# Patient Record
Sex: Female | Born: 1970 | Race: White | Hispanic: No | Smoking: Current every day smoker
Health system: Southern US, Community
[De-identification: ages and names within clinical notes are randomized; demographics above are authoritative.]

## PROBLEM LIST (undated history)

## (undated) DIAGNOSIS — F32A Depression, unspecified: Secondary | ICD-10-CM

## (undated) DIAGNOSIS — R29898 Other symptoms and signs involving the musculoskeletal system: Secondary | ICD-10-CM

## (undated) DIAGNOSIS — F419 Anxiety disorder, unspecified: Secondary | ICD-10-CM

## (undated) DIAGNOSIS — I10 Essential (primary) hypertension: Secondary | ICD-10-CM

## (undated) DIAGNOSIS — D649 Anemia, unspecified: Secondary | ICD-10-CM

## (undated) DIAGNOSIS — F329 Major depressive disorder, single episode, unspecified: Secondary | ICD-10-CM

## (undated) HISTORY — DX: Anemia, unspecified: D64.9

## (undated) HISTORY — DX: Anxiety disorder, unspecified: F41.9

---

## 2002-09-30 DIAGNOSIS — G4700 Insomnia, unspecified: Secondary | ICD-10-CM | POA: Insufficient documentation

## 2005-04-09 ENCOUNTER — Emergency Department: Payer: Self-pay | Admitting: Emergency Medicine

## 2005-05-12 ENCOUNTER — Emergency Department: Payer: Self-pay | Admitting: Emergency Medicine

## 2005-05-12 ENCOUNTER — Other Ambulatory Visit: Payer: Self-pay

## 2005-05-31 ENCOUNTER — Ambulatory Visit: Payer: Self-pay

## 2005-08-24 ENCOUNTER — Emergency Department: Payer: Self-pay | Admitting: Emergency Medicine

## 2005-10-04 ENCOUNTER — Ambulatory Visit: Payer: Self-pay | Admitting: Family Medicine

## 2006-10-20 DIAGNOSIS — I10 Essential (primary) hypertension: Secondary | ICD-10-CM | POA: Insufficient documentation

## 2007-08-25 DIAGNOSIS — K089 Disorder of teeth and supporting structures, unspecified: Secondary | ICD-10-CM | POA: Insufficient documentation

## 2007-12-29 ENCOUNTER — Ambulatory Visit: Payer: Self-pay | Admitting: Obstetrics and Gynecology

## 2009-02-27 ENCOUNTER — Emergency Department: Payer: Self-pay

## 2009-03-01 ENCOUNTER — Other Ambulatory Visit: Payer: Self-pay | Admitting: Family Medicine

## 2009-03-03 ENCOUNTER — Other Ambulatory Visit: Payer: Self-pay | Admitting: Family Medicine

## 2009-03-05 LAB — HM COLONOSCOPY: HM Colonoscopy: NORMAL

## 2009-03-20 ENCOUNTER — Other Ambulatory Visit: Payer: Self-pay | Admitting: Family Medicine

## 2009-04-28 ENCOUNTER — Other Ambulatory Visit: Payer: Self-pay

## 2009-05-04 ENCOUNTER — Ambulatory Visit: Payer: Self-pay

## 2010-10-20 ENCOUNTER — Emergency Department: Payer: Self-pay | Admitting: Emergency Medicine

## 2010-12-05 DIAGNOSIS — M533 Sacrococcygeal disorders, not elsewhere classified: Secondary | ICD-10-CM | POA: Insufficient documentation

## 2011-02-08 DIAGNOSIS — N920 Excessive and frequent menstruation with regular cycle: Secondary | ICD-10-CM | POA: Insufficient documentation

## 2011-04-25 DIAGNOSIS — R892 Abnormal level of other drugs, medicaments and biological substances in specimens from other organs, systems and tissues: Secondary | ICD-10-CM | POA: Insufficient documentation

## 2011-12-27 DIAGNOSIS — M5414 Radiculopathy, thoracic region: Secondary | ICD-10-CM | POA: Insufficient documentation

## 2012-01-17 DIAGNOSIS — M542 Cervicalgia: Secondary | ICD-10-CM | POA: Insufficient documentation

## 2012-04-10 DIAGNOSIS — N3946 Mixed incontinence: Secondary | ICD-10-CM | POA: Insufficient documentation

## 2012-04-10 DIAGNOSIS — R350 Frequency of micturition: Secondary | ICD-10-CM | POA: Insufficient documentation

## 2013-02-03 ENCOUNTER — Other Ambulatory Visit: Payer: Self-pay | Admitting: Family Medicine

## 2013-02-03 LAB — COMPREHENSIVE METABOLIC PANEL
Albumin: 3.3 g/dL — ABNORMAL LOW (ref 3.4–5.0)
Alkaline Phosphatase: 131 U/L (ref 50–136)
Anion Gap: 7 (ref 7–16)
BUN: 5 mg/dL — ABNORMAL LOW (ref 7–18)
Bilirubin,Total: 0.3 mg/dL (ref 0.2–1.0)
Calcium, Total: 8.7 mg/dL (ref 8.5–10.1)
Chloride: 103 mmol/L (ref 98–107)
Co2: 27 mmol/L (ref 21–32)
Creatinine: 0.65 mg/dL (ref 0.60–1.30)
EGFR (Non-African Amer.): >60
Glucose: 80 mg/dL (ref 65–99)
Osmolality: 270 (ref 275–301)
SGOT(AST): 32 U/L (ref 15–37)
SGPT (ALT): 23 U/L (ref 12–78)
Sodium: 137 mmol/L (ref 136–145)
Total Protein: 6.8 g/dL (ref 6.4–8.2)

## 2013-02-03 LAB — TSH: Thyroid Stimulating Horm: 1.28 u[IU]/mL

## 2013-02-03 LAB — CBC
MCH: 30.7 pg (ref 26.0–34.0)
MCHC: 33.6 g/dL (ref 32.0–36.0)
Platelet: 241 10*3/uL (ref 150–440)
RDW: 16.6 % — ABNORMAL HIGH (ref 11.5–14.5)
WBC: 10 10*3/uL (ref 3.6–11.0)

## 2013-02-23 DIAGNOSIS — M5137 Other intervertebral disc degeneration, lumbosacral region: Secondary | ICD-10-CM | POA: Insufficient documentation

## 2013-06-29 DIAGNOSIS — J342 Deviated nasal septum: Secondary | ICD-10-CM | POA: Insufficient documentation

## 2013-06-29 DIAGNOSIS — J309 Allergic rhinitis, unspecified: Secondary | ICD-10-CM | POA: Insufficient documentation

## 2013-06-29 DIAGNOSIS — R519 Headache, unspecified: Secondary | ICD-10-CM | POA: Insufficient documentation

## 2013-06-29 DIAGNOSIS — J3489 Other specified disorders of nose and nasal sinuses: Secondary | ICD-10-CM | POA: Insufficient documentation

## 2013-11-26 DIAGNOSIS — M5126 Other intervertebral disc displacement, lumbar region: Secondary | ICD-10-CM | POA: Insufficient documentation

## 2014-02-18 DIAGNOSIS — F1190 Opioid use, unspecified, uncomplicated: Secondary | ICD-10-CM | POA: Insufficient documentation

## 2014-05-27 DIAGNOSIS — Z79899 Other long term (current) drug therapy: Secondary | ICD-10-CM | POA: Insufficient documentation

## 2014-08-29 ENCOUNTER — Ambulatory Visit: Payer: Self-pay | Admitting: Family Medicine

## 2014-08-29 LAB — GLUCOSE, RANDOM: Glucose: 108 mg/dL — ABNORMAL HIGH (ref 65–99)

## 2014-09-01 LAB — FOLATE: FOLIC ACID: 9.2 ng/mL (ref 3.1–100.0)

## 2014-09-15 ENCOUNTER — Inpatient Hospital Stay: Payer: Self-pay | Admitting: Specialist

## 2014-09-15 DIAGNOSIS — T50902A Poisoning by unspecified drugs, medicaments and biological substances, intentional self-harm, initial encounter: Secondary | ICD-10-CM | POA: Insufficient documentation

## 2014-09-15 LAB — CBC WITH DIFFERENTIAL/PLATELET
BASOS ABS: 0.2 10*3/uL — AB (ref 0.0–0.1)
BASOS PCT: 0.8 %
Eosinophil #: 0 10*3/uL (ref 0.0–0.7)
Eosinophil %: 0.1 %
HCT: 55.1 % — ABNORMAL HIGH (ref 35.0–47.0)
HGB: 17.9 g/dL — ABNORMAL HIGH (ref 12.0–16.0)
Lymphocyte #: 1 10*3/uL (ref 1.0–3.6)
Lymphocyte %: 5.4 %
MCH: 31.9 pg (ref 26.0–34.0)
MCHC: 32.5 g/dL (ref 32.0–36.0)
MCV: 98 fL (ref 80–100)
Monocyte #: 0.8 x10 3/mm (ref 0.2–0.9)
Monocyte %: 4.4 %
Neutrophil #: 16.7 10*3/uL — ABNORMAL HIGH (ref 1.4–6.5)
Neutrophil %: 89.3 %
PLATELETS: 188 10*3/uL (ref 150–440)
RBC: 5.62 10*6/uL — ABNORMAL HIGH (ref 3.80–5.20)
RDW: 15.1 % — AB (ref 11.5–14.5)
WBC: 18.7 10*3/uL — ABNORMAL HIGH (ref 3.6–11.0)

## 2014-09-15 LAB — URINALYSIS, COMPLETE
BILIRUBIN, UR: NEGATIVE
GLUCOSE, UR: NEGATIVE mg/dL (ref 0–75)
KETONE: NEGATIVE
Leukocyte Esterase: NEGATIVE
Nitrite: NEGATIVE
PH: 5 (ref 4.5–8.0)
Protein: NEGATIVE
RBC,UR: <1 /HPF (ref 0–5)
Specific Gravity: 1.015 (ref 1.003–1.030)
Squamous Epithelial: <1
WBC UR: <1 /HPF (ref 0–5)

## 2014-09-15 LAB — COMPREHENSIVE METABOLIC PANEL
Albumin: 3.2 g/dL — ABNORMAL LOW (ref 3.4–5.0)
Alkaline Phosphatase: 128 U/L — ABNORMAL HIGH
Anion Gap: 8 (ref 7–16)
BUN: 8 mg/dL (ref 7–18)
Bilirubin,Total: 0.6 mg/dL (ref 0.2–1.0)
CALCIUM: 8.5 mg/dL (ref 8.5–10.1)
CO2: 23 mmol/L (ref 21–32)
Chloride: 111 mmol/L — ABNORMAL HIGH (ref 98–107)
Creatinine: 0.75 mg/dL (ref 0.60–1.30)
EGFR (African American): >60
EGFR (Non-African Amer.): >60
Glucose: 121 mg/dL — ABNORMAL HIGH (ref 65–99)
Osmolality: 283 (ref 275–301)
Potassium: 3.8 mmol/L (ref 3.5–5.1)
SGOT(AST): 190 U/L — ABNORMAL HIGH (ref 15–37)
SGPT (ALT): 42 U/L
SODIUM: 142 mmol/L (ref 136–145)
TOTAL PROTEIN: 6.7 g/dL (ref 6.4–8.2)

## 2014-09-15 LAB — PREGNANCY, URINE: Pregnancy Test, Urine: NEGATIVE m[IU]/mL

## 2014-09-15 LAB — DRUG SCREEN, URINE
AMPHETAMINES, UR SCREEN: NEGATIVE (ref ?–1000)
Barbiturates, Ur Screen: NEGATIVE (ref ?–200)
Benzodiazepine, Ur Scrn: POSITIVE (ref ?–200)
CANNABINOID 50 NG, UR ~~LOC~~: NEGATIVE (ref ?–50)
COCAINE METABOLITE, UR ~~LOC~~: NEGATIVE (ref ?–300)
MDMA (ECSTASY) UR SCREEN: NEGATIVE (ref ?–500)
METHADONE, UR SCREEN: NEGATIVE (ref ?–300)
Opiate, Ur Screen: POSITIVE (ref ?–300)
PHENCYCLIDINE (PCP) UR S: NEGATIVE (ref ?–25)
Tricyclic, Ur Screen: POSITIVE (ref ?–1000)

## 2014-09-15 LAB — PROTIME-INR
INR: 1
Prothrombin Time: 12.8 secs (ref 11.5–14.7)

## 2014-09-15 LAB — SALICYLATE LEVEL: Salicylates, Serum: 6.4 mg/dL — ABNORMAL HIGH

## 2014-09-15 LAB — ETHANOL: Ethanol: <3 mg/dL

## 2014-09-15 LAB — APTT: ACTIVATED PTT: 25.8 s (ref 23.6–35.9)

## 2014-09-15 LAB — ACETAMINOPHEN LEVEL: Acetaminophen: <2 ug/mL

## 2014-09-15 LAB — TROPONIN I

## 2014-09-15 LAB — AMMONIA: Ammonia, Plasma: 40 mcmol/L — ABNORMAL HIGH (ref 11–32)

## 2014-09-16 LAB — HEPATIC FUNCTION PANEL A (ARMC)
ALBUMIN: 2.9 g/dL — AB (ref 3.4–5.0)
ALK PHOS: 113 U/L
BILIRUBIN TOTAL: 0.6 mg/dL (ref 0.2–1.0)
SGOT(AST): 159 U/L — ABNORMAL HIGH (ref 15–37)
SGPT (ALT): 46 U/L
TOTAL PROTEIN: 6.6 g/dL (ref 6.4–8.2)

## 2014-09-16 LAB — CBC WITH DIFFERENTIAL/PLATELET
BASOS ABS: 0.1 10*3/uL (ref 0.0–0.1)
Basophil %: 0.8 %
Eosinophil #: 0 10*3/uL (ref 0.0–0.7)
Eosinophil %: 0.2 %
HCT: 52 % — ABNORMAL HIGH (ref 35.0–47.0)
HGB: 17.1 g/dL — ABNORMAL HIGH (ref 12.0–16.0)
Lymphocyte #: 1.5 10*3/uL (ref 1.0–3.6)
Lymphocyte %: 11.7 %
MCH: 32.2 pg (ref 26.0–34.0)
MCHC: 32.9 g/dL (ref 32.0–36.0)
MCV: 98 fL (ref 80–100)
Monocyte #: 1 x10 3/mm — ABNORMAL HIGH (ref 0.2–0.9)
Monocyte %: 7.9 %
NEUTROS PCT: 79.4 %
Neutrophil #: 10.5 10*3/uL — ABNORMAL HIGH (ref 1.4–6.5)
Platelet: 170 10*3/uL (ref 150–440)
RBC: 5.3 10*6/uL — ABNORMAL HIGH (ref 3.80–5.20)
RDW: 15 % — ABNORMAL HIGH (ref 11.5–14.5)
WBC: 13.2 10*3/uL — AB (ref 3.6–11.0)

## 2014-09-16 LAB — AMMONIA: AMMONIA, PLASMA: 29 umol/L (ref 11–32)

## 2014-09-16 LAB — HEMOGLOBIN A1C: Hemoglobin A1C: 5.3 % (ref 4.2–6.3)

## 2014-09-17 ENCOUNTER — Inpatient Hospital Stay: Payer: Self-pay | Admitting: Psychiatry

## 2014-09-17 DIAGNOSIS — R0602 Shortness of breath: Secondary | ICD-10-CM

## 2014-09-19 LAB — HEPATIC FUNCTION PANEL A (ARMC)
Albumin: 3.1 g/dL — ABNORMAL LOW (ref 3.4–5.0)
Alkaline Phosphatase: 80 U/L
Bilirubin, Direct: 0.2 mg/dL (ref 0.0–0.2)
Bilirubin,Total: 1 mg/dL (ref 0.2–1.0)
SGOT(AST): 73 U/L — ABNORMAL HIGH (ref 15–37)
SGPT (ALT): 46 U/L
Total Protein: 6.6 g/dL (ref 6.4–8.2)

## 2014-09-19 LAB — CBC WITH DIFFERENTIAL/PLATELET
BASOS ABS: 0.1 10*3/uL (ref 0.0–0.1)
BASOS PCT: 1.5 %
Eosinophil #: 0.1 10*3/uL (ref 0.0–0.7)
Eosinophil %: 1.4 %
HCT: 43.8 % (ref 35.0–47.0)
HGB: 14.3 g/dL (ref 12.0–16.0)
LYMPHS ABS: 1.4 10*3/uL (ref 1.0–3.6)
Lymphocyte %: 18.7 %
MCH: 32.2 pg (ref 26.0–34.0)
MCHC: 32.7 g/dL (ref 32.0–36.0)
MCV: 99 fL (ref 80–100)
MONOS PCT: 7.1 %
Monocyte #: 0.5 x10 3/mm (ref 0.2–0.9)
NEUTROS ABS: 5.5 10*3/uL (ref 1.4–6.5)
Neutrophil %: 71.3 %
Platelet: 142 10*3/uL — ABNORMAL LOW (ref 150–440)
RBC: 4.45 10*6/uL (ref 3.80–5.20)
RDW: 14.2 % (ref 11.5–14.5)
WBC: 7.7 10*3/uL (ref 3.6–11.0)

## 2014-09-26 DIAGNOSIS — M5416 Radiculopathy, lumbar region: Secondary | ICD-10-CM | POA: Insufficient documentation

## 2015-01-21 NOTE — Consult Note (Signed)
PATIENT NAME:  Marilyn Brown, Marilyn Brown MR#:  [id number] DATE OF BIRTH:  11-14-1970  DATE OF CONSULTATION:  09/16/2014  CONSULTING PHYSICIAN:  Audery AmelJohn T. Gerlene Glassburn, MD  IDENTIFYING INFORMATION AND REASON FOR CONSULTATION: A 44 year old woman brought into the hospital unresponsive and initially needing intubation. Consult for concern about overdose.   HISTORY OF PRESENT ILLNESS: Information obtained from the chart and the patient. The chart states that when she was brought to the hospital, there was concern voiced by the family about an overdose. It was stated that her family said she had called them up and told them she was going to overdose on all of her medicine. When her husband came home and found her, she was passed out and brought into the hospital. On interview today, the patient simply states that she passed out on the floor and that is all she knows. She completely denies having overdosed on any of her medicine. She says she does not remember any kind of symptoms in particular prior to falling out at home. Says that maybe she was a little sick this week, but nothing dramatic and had not been feeling dizzy. She denies that she has been particularly depressed. Says that she sleeps normally with her sleeping medicine. She denies any changes to her appetite. Denies any hallucinations. She says that her stress in her life mainly comes from her interactions with her mother and brother. She gets very animated, stating that her mother and brother have treated her like "shit." It is not clear exactly what she is referring to. The patient states that she takes her hydrocodone 5 mg twice a day and has not overdosed. Says that she takes her Xanax pills only about 1 time per week. Denies using drugs of any other sort. Denies any alcohol use.   PAST PSYCHIATRIC HISTORY: She denies ever seeing a psychiatrist. Denies psychiatric hospitalizations. Denies any suicide attempts in the past. Says that she has had nerve problems for  years, but apparently that has only been dealt with through her primary care doctor. She is hesitant to describe exactly what she is talking about with the nerve problems.   FAMILY HISTORY: Denies any family history of mental illness.   SOCIAL HISTORY: Married. Husband works apparently during the day. The patient stays at home. She says she is unable to work because of her chronic back pain. Has 2 adult children, both in the [employer]. Seems to have a very difficult relationship with her mother and her brother.   PAST MEDICAL HISTORY: Apparently has chronic back pain and is on chronic narcotic pain medicine. Also, high blood pressure. No other known ongoing medical problems that she reports.   SUBSTANCE ABUSE HISTORY: She says she drinks only occasionally and has never had a problem with it. Denies heavy drug use. Denies any past history of alcohol or drug dependence.   CURRENT MEDICATIONS: The patient told me that the only pills she takes are her hydrocodone, her trazodone and her blood pressure medicine and nothing else. She then said she takes about 1 Xanax per week. The chart suggests that she takes oxymorphone 5 mg twice a day, Lyrica 100 mg 3 times a day, nortriptyline 20 mg a day, trazodone 100 mg at night, Zyrtec 10 mg a day, alprazolam 2 mg up to 3 times a day as needed, atenolol 50 mg a day, baclofen 10 mg at least once a day, tizanidine 4 mg every 8 hours as needed for pain, Detrol LA 2 mg once a  day.   ALLERGIES: CODEINE AND PENICILLIN.   REVIEW OF SYSTEMS: She says she is still feeling a little woozy out of it. Denies any other specific complaints.   MENTAL STATUS EXAMINATION: Neatly groomed woman, looks her stated age. She rolled her eyes and made it clear that she was opposed to my being involved. Insisted on having her husband stay in the room for the interview. Eye contact is good. Psychomotor activity is calm. Speech decreased in total amount, but normal in rate and tone. Affect is  somewhat blunted and blank. Does not appear to be particularly concerned about the current situation. Mood is described as being fine. Thoughts are generally lucid, although she does not give much detail about things. It is somewhat remarkable how unconcerned she is about this situation. She has no sign of delusions. Denies hallucinations. She denies any suicidal or homicidal ideation. She is not really alert and oriented. She thinks she is in [place] and gives the year as 2007. She repeats 3 words immediately, but could only remember one of them at 3 minutes. Insight and judgment poor. Baseline intelligence presumably normal.   LABORATORY RESULTS: Drug screen was positive for tricyclics, benzodiazepines and opiates. She had an initially elevated ammonia at 40. Urinalysis positive just for trace blood. Salicylates and acetaminophen not tested. Pregnancy test negative. Blood glucose slightly elevated. She has some elevated liver function tests: Alkaline phosphatase 128, AST elevated at 190, albumin low at 3.2. Salicylates slightly elevated at 6.4. White count 18.7 with an elevated hematocrit of 55.   DIAGNOSTIC DATA: Head CT unremarkable.   ASSESSMENT: This is a 44 year old woman, who was found passed out at home. Someone had reported that she had made a suicide threat, although I have not been able to track down who had said that earlier. The patient is now completely denying the chance of this being suicidal, but has no explanation for why she was passed out. Certainly, the most likely thing is that she overdosed on her multiple drugs. She is on multiple medications that are oversedating and dangerous, particularly when taken together. The patient is already misrepresenting her prescription medicine to me. Clearly is invested only in not cooperating with treatment and wanting to be released from the hospital. I suspect that there is more going on here then she is letting on. Have high suspicion that she  did intentionally overdose.   TREATMENT PLAN: I would not discontinue her involuntary commitment. If she is discharged to the floor, I think she should continue to have a sitter. Unfortunately, we do not yet have any beds available downstairs.   DIAGNOSES:  1. Depression, not otherwise specified.  2. Rule out opiate and benzodiazepine abuse.     ____________________________ Audery Amel, MD jtc:TT D: 09/16/2014 18:11:58 ET T: 09/16/2014 18:45:44 ET JOB#: [id number]  cc: Audery Amel, MD, <Dictator> Audery Amel MD ELECTRONICALLY SIGNED 09/21/2014 0:40

## 2015-01-21 NOTE — Consult Note (Signed)
PATIENT NAME:  Marilyn MoatWARREN, Shamiya G MR#:  [id number] DATE OF BIRTH:  1970-11-30  DATE OF CONSULTATION:  09/19/2014  CONSULTING PHYSICIAN:  Pauletta BrownsYuriy Rosaleah Person, MD  REASON FOR CONSULTATION:  Right lower extremity neuropathy.  HISTORY OF PRESENT ILLNESS:  A 44 year old female status post admission on December 19 to [facility] status post intubation and at that time suspected overdose for benzos and opioids even though the patient is denying that she overdosed. She also denies any history of drug abuse or prescription medications. She denies any suicidal ideations. The patient states she was in normal health prior. Currently complaining of L4-L5 neuropathy and states started on admission and believes that this is due to the fall. Currently no back pain. On further questioning examination, the patient was able to ambulate with a walker per physical therapy.  REVIEW OF SYSTEMS:  No shortness of breath. No chest pain. No abdominal pain. No heat or cold intolerance. No new signs of constipation, diarrhea.   PAST MEDICAL HISTORY: Reviewed.    LABORATORY WORKUP:  Has been reviewed.    PHYSICAL EXAMINATION:  The patient is able to follow commands. Speech appears fluent, no signs of dysarthria. No signs of aphasia. Extraocular movements intact. Facial sensation intact. Facial motor is intact. Upper extremities intact. Motor strength 5/5 in bilateral upper extremities. Lower extremities 0/5 on dorsiflexion and plantar flexion, decreased sensation in the L5 and L4 nerve roots on right lower extremity   IMPRESSION: A 44 year old female status post overdose on benzodiazepines and opiates status post fall, extubated.  A neurological evaluation for right lower extremity numbness.   On coronary examination, the patient has numbness in the L4 and L5 region of the right lower extremity. She 0/5 in dorsi and plantar flexion, but when further examining I believe there is a lot of giveaway weakness. The  patient was able to ambulate with a walker. She would not be able to ambulate with a walker with that kind of weakness. When trying to stand her up the patient puts all her weight on the left foot as if she is just trying to stand on that foot. Again, I am not convinced this is all real. At the same time, cannot rule out her neuropathy in the L4-L5 region.    PLAN:   For that reason my plan would consider MRI of the lumbar sacral spine with and without contrast. I think this can be done as outpatient and if this is unhelpful on reviewing the patient should follow up with neurology as an outpatient for EMG and nerve conduction studies. It does not appear that the neuropathy, the tingling and the numbness that the patient describes is bothering her. It was a pleasure seeing this patient. Please call with any questions.    ____________________________ Pauletta BrownsYuriy Jamielynn Wigley, MD yz:at D: 09/19/2014 16:12:08 ET T: 09/19/2014 16:36:18 ET JOB#: [id number]  cc: Pauletta BrownsYuriy Marcin Holte, MD, <Dictator> Pauletta BrownsYURIY Quayshawn Nin MD ELECTRONICALLY SIGNED 09/28/2014 13:50

## 2015-01-21 NOTE — Consult Note (Signed)
PATIENT NAME:  Marilyn MoatWARREN, Brilee G MR#:  [id number] DATE OF BIRTH:  03-26-1971  DATE OF CONSULTATION:  09/17/2014  REFERRING PHYSICIAN:   CONSULTING PHYSICIAN:  Jahleel Stroschein K. Essa Malachi, MD  IDENTIFYING INFORMATION: Age is 44 years. Sex is female. Race is white.   SUBJECTIVE: The patient was seen for consultation in room number 113, as requested by the floor physician. The patient is a 44 year old white female who was brought to the hospital unresponsive and initially needing intubation. A consult was obtained for concerns about overdose. For a detailed evaluation, please refer to the excellent note done by Dr. Toni Amendlapacs on 09/16/2014. The patient was seen in the room with a lot of family members, and she wanted all of them to leave before the undersigned saw her in consultation.   OBJECTIVE: The patient appears to be relaxed and lying on the bed. Alert and oriented, competent and cooperative. No agitation. Her affect is appropriate with her mood which appears to be low and down  and anxious, but the patient denies feeling depressed, denies feeling hopeless or helpless. She reported that she did not know what she took and whose medications they were, and she could not give information that was reliable. Does not appear to be responding to internal stimuli. The patient has been misinterpreting her prescription medication. She reports that she did make a suicide threat although she says that she is feeling okay right now. Denies active suicidal or homicidal plans. Insight and judgment guarded. Impulse control is poor and behavior is unpredictable.   IMPRESSION: Depressive disorder, not otherwise specified. Opiate and benzodiazepine abuse to be ruled out. Recommend continue current meds and transfer the patient to behavioral health clinic after she is medically cleared and stable, for further help with her depression and knowing the consequences of her behavior so that she can be given help as needed.     ____________________________ Jannet MantisSurya K. Guss Bundehalla, MD skc:mw D: 09/17/2014 14:48:55 ET T: 09/17/2014 15:43:03 ET JOB#: [id number]  cc: Monika SalkSurya K. Guss Bundehalla, MD, <Dictator> Beau FannySURYA K Thomasene Dubow MD ELECTRONICALLY SIGNED 09/20/2014 8:57

## 2015-01-21 NOTE — H&P (Signed)
PATIENT NAME:  Marilyn MoatWARREN, [name] MR#:  [id number] DATE OF BIRTH:  01/20/1971  DATE OF ADMISSION:  09/17/2014  ADDENDUM: This is an addendum to be added to the recently dictated H and P for this patient dictated by myself, Wallace GoingAlton Stephane Niemann, MD. Please add a section, mental status examination, please add that after the section, review of systems.  MENTAL STATUS EXAMINATION: The patient was a young female appearing her stated age of 44. She was alert and oriented x 4. Her thought process was linear and goal directed. She was in a wheelchair, however, she was able to move all extremities except for the right foot. She made good eye contact. Her thought content: She denied suicidal ideation, denied homicidal ideation, denied any auditory hallucination, denied any visual hallucinations. Her mood she stated was "better." Her affect was brought, smiling. She was somewhat able to laugh about the rearrangement of the chairs in the office to get her wheelchair in the office. Her insight and judgment outwardly appeared good, however, given conflicting information and indications of an intentional overdose or potential misuse of medication, her insight and judgment are likely poor. She stated the date as 09/13/2014. She was able to state the location where we were, the unit behavioral medicine and the county. She was able to repeat 3/3 objects immediately; however, she was not able to recall any of 3 objects after 3 minutes. She was able to draw intersecting pentagons. She was able to write a sentence correctly. She was able to follow 3-stage command. She was able to read a 1-stage written command. She was able to identify objects displayed to her.   ____________________________ Loralie ChampagneAlton L. Mayford KnifeWilliams, MD alw:TT D: 09/18/2014 12:37:32 ET T: 09/18/2014 14:44:35 ET JOB#: [id number]  cc: Leory PlowmanAlton L. Mayford KnifeWilliams, MD, <Dictator> Kerin SalenALTON L Shemika Robbs MD ELECTRONICALLY SIGNED 09/29/2014 11:56

## 2015-01-21 NOTE — H&P (Signed)
PATIENT NAME:  Donnal MoatWARREN, Mikael G MR#:  [id number] DATE OF BIRTH:  [birth year]  DATE OF ADMISSION:  09/15/2014  PRIMARY CARE PROVIDER:  Demetrios Isaacsonald E. Fisher, MD.    CHIEF COMPLAINT: Oxymorphone overdose and possibly other medications and unresponsiveness.   HISTORY OF PRESENTING ILLNESS: The patient is presently intubated in the Emergency Room.  History is adopted from old records, ER staff, and family. IVC is done.  A 44 year old patient with history of hypertension, chronic pain, anxiety on narcotics and benzodiazepines called her family over the phone and said that she was taking all her oxymorphone pills. These were filled yesterday with 60 pills. On arrival, the patient was completely unresponsive. EMS was called.  The patient had to be intubated here in the Emergency Room. Her CT scan of the head was done which is normal. Blood work is pending.   Her urine drug screen is positive for benzodiazepines, narcotics and tricyclic antidepressants.  She does have history of depression, but no history of suicide.   PAST MEDICAL HISTORY:  1. Hypertension.  2. Anxiety.  3. Depression.  4. Chronic pain syndrome.   ALLERGIES: PENICILLIN AND CODEINE.   SOCIAL HISTORY: No tobacco, alcohol abuse per old records.   REVIEW OF SYSTEMS: Unobtainable as the patient is intubated.   FAMILY HISTORY: Reviewed but unknown.   HOME MEDICATIONS:  1. Alprazolam 2 mg oral 3 times a day as needed.  2. Atenolol 50 mg oral 2 times a day.   3. Baclofen 10 mg oral once a day.  5. Detrol LA 2 mg extended release once a day.  6. Lyrica 100 mg 3 times a day.  7. Nortriptyline 10 mg 2 capsules daily.  8. Oxymorphone 5 mg extended-release 2 times a day.  9. Tizanidine 4 mg oral every 8 hours as needed.  10. Trazodone 100 mg oral once a day at bedtime.   PHYSICAL EXAMINATION:  VITAL SIGNS: Pulse of 54, blood pressure 151/104, saturating 100% on the ventilator.  GENERAL: Obese, Caucasian female patient lying in bed  intubated, sedated, not responding to painful stimuli.  HEENT: Atraumatic, normocephalic.  ET tube in place. Pupils pinpoint bilaterally.  NECK: Supple. No thyromegaly. No JVD.  CARDIOVASCULAR: S1, S2, bradycardic. No murmurs or edema.  RESPIRATORY: Coarse breath sounds on both sides but good air entry.  GASTROINTESTINAL: Soft abdomen.  No dystrophy, guarding found. Bowel sounds present.  GENITOURINARY:  Has a Foley catheter in place with clear urine.  NEUROLOGICAL:  Babinski's downgoing of both sides.   LABORATORY STUDIES: Glucose of 117, sodium 140, potassium 3.8, chloride 111, ammonia 40.  Urine screen positive for benzodiazepines, opiates and tricyclic antidepressants.  Urinalysis:  Trace bacteria, less than 1 WBC, INR 1, PTT 25.8. Urine pregnancy test negative.  ABG with pH of 7.39, pCO2 37, pO2 of 187, lactic acid 1.7.   Chest x-ray shows tubes and lines in place. Nothing acute in the cardiopulmonary area.   ASSESSMENT AND PLAN:  1. Polysubstance intentional overdose. The patient is presently intubated on the ventilator. Involuntary commitment is in place. We will need to consult psychiatry once the patient is extubated, awake. At this time, we will hold any sedation.  Can be started on propofol drip if the patient is awake on the ventilator. Consult pulmonary for further management of the vent.  2. Deep vein thrombosis prophylaxis with Lovenox. 3. Code status: Full code.   TIME SPENT TODAY ON THIS CASE: 40 minutes.     ____________________________ Molinda BailiffSrikar R. Preslee Regas, MD srs:by  D: 09/15/2014 20:30:21 ET T: 09/15/2014 20:54:38 ET JOB#: [id number]  cc: Wardell Heath R. Danzel Marszalek, MD, <Dictator> Demetrios Isaacs. Sherrie Mustache, MD Orie Fisherman MD ELECTRONICALLY SIGNED 09/16/2014 12:41

## 2015-01-21 NOTE — Discharge Summary (Signed)
PATIENT NAME:  Marilyn Brown, Aprill G MR#:  [id number] DATE OF BIRTH:  [birth year]  For a detailed note, please see the history and physical note on admission by Dr. Elpidio AnisSudini.   DIAGNOSES AT DISCHARGE:  1.  Suicide attempt, drug overdose. 2.  Acute respiratory failure secondary to metabolic encephalopathy from the drug overdose. 3.  Chronic pain. 4.  Depression. 5.  Hypertension.   The patient was discharged to Behavioral Medicine.   DIET:  The patient  was discharged on a regular diet.   ACTIVITY: As tolerated.   FOLLOWUP: Dr. Mila Merryonald Fisher.   DISCHARGE MEDICATIONS: As follows: Atenolol 50 mg b.i.d., Xanax 2 mg t.i.d., trazodone 100 mg at bedtime, cetirizine 10 mg daily, baclofen 10 mg daily, Lyrica 100 mg t.i.d., nortriptyline 10 mg 2 tabs daily, Detrol LA 2 mg daily, tizanidine 4 mg q. 8 hours, oxymorphone 5 mg every 12 hours.  CONSULTANTS DURING THE HOSPITAL COURSE: Audery AmelJohn T. Clapacs, MD and Jannet MantisSurya K. Guss Bundehalla, MD from psychiatry,  Dory LarsenKurian D. Kasa, MD, from pulmonary critical care.   PERTINENT STUDIES DONE DURING THE HOSPITAL COURSE: As follows: A CT scan of the head done without contrast showing no acute intracranial abnormality. A chest x-ray done on admission showing no acute abnormality. An ultrasound of the abdomen limited showing normal right upper quadrant ultrasound.   A 2-dimensional echocardiogram showing normal ejection fraction of 60%-65%.   BRIEF HOSPITAL COURSE: This is a 44 year old female with medical problems as mentioned above, who presented to the hospital due to altered mental status and confusion, and intubated due to airway protection from severe metabolic encephalopathy.   Problem #1.  Drug overdose and suicide attempt. The patient took a significant amount of opioids, oxymorphone, and was intubated for airway protection. The patient was shortly thereafter extubated the day after. She was seen by psychiatry. They continued involuntary commitment. The patient was seen by them  and therefore taken down to behavioral medicine after she was medically stable. She will continue her psychiatric medications as stated above and further care is as per psychiatry.  Problem #2.  Acute respiratory failure. This was secondary to metabolic encephalopathy from the drug overdose. The patient was intubated for airway protection, had no acute respiratory issues. She has currently been extubated. Is clinically stable from a respiratory standpoint; therefore, being discharged. Problem #3. Chest pain. This was likely musculoskeletal in nature. The patient had no acute cardiac issue. Her cardiac markers were negative. She had an echocardiogram which showed no evidence of any acute wall motion abnormalities, any LV dysfunction.  Problem #4.  Hyperglycemia. There was no evidence of any acute diabetes. Hemoglobin A1c was stable.  Problem #5.  Anxiety and depression. The patient was maintained on her Cymbalta and Xanax. She will resume that.  Problem #6.   Chronic pain. The patient's oxymorphone was held as she took too many of them as a suicide attempt. This is to be further addressed with psychiatry prior to discharge.   CODE STATUS: The patient is a full code.   DISPOSITION: She was discharged to behavioral medicine.    TIME SPENT: 35 minutes.    ____________________________ Rolly PancakeVivek J. Cherlynn KaiserSainani, MD vjs:LT D: 09/18/2014 15:01:00 ET T: 09/18/2014 21:12:41 ET JOB#: [id number]  cc: Rolly PancakeVivek J. Cherlynn KaiserSainani, MD, <Dictator> Demetrios Isaacsonald E. Sherrie MustacheFisher, MD Houston SirenVIVEK J Kashish Yglesias MD ELECTRONICALLY SIGNED 09/29/2014 10:44

## 2015-01-21 NOTE — H&P (Signed)
PATIENT NAME:  Marilyn Brown, Marilyn Brown MR#:  [id number] DATE OF BIRTH:  1971-03-10   DATE OF ADMISSION:  09/17/2014  IDENTIFYING INFORMATION:  The patient is a 44 year old married female who was admitted to the hospital on December 17, after being found unconscious at home.    CHIEF COMPLAINT:  "Passed out on the kitchen floor."    HISTORY OF PRESENT ILLNESS:  The patient indicates that she does not remember anything immediately prior to being found passed out on her kitchen floor.  She states that when she was found that authorities looked through her cell phone and deduced that she had talked to her mother.  The patient indicated that she had an argument with her mother and had not talked to her mother in one year.  She states that her mood had been good.  She states that she had just returned from the beach with her husband.  She states that they had spent Thanksgiving at the beach and then she and her husband went back to go fishing and had just returned from the second trip.  She stated she had cleaned the house that day.  She states she had also worked outside that day pulling weeds and preparing her yard to become a Garment/textile technologistmonarch butterfly field.  She states that that involves planting certain types of flowers (i.e., butterfly bush, black-eyed Susans and milk weed).    She states that she had not been experiencing anxiety.  She denies any psychotic symptoms or manic symptoms.    She states she has been taking all of her medications including her pain medication regimen for years.  She denies any changes in the does or that she took them outside of the way they are prescribed.    There is conflicting information from collateral sources.  The note from the medical providers during this admission indicates that the patient called her family over the phone and said she was taking all of her oxymorphone pills.  This note was dated September 15, 2014.  That note also references that the medications were filled  yesterday which would have been September 14, 2014, with 60 pills.  They note that on arrival to this facility she was completely unresponsive.  This conflicting information raises questions about the patient's reliability.    The patient was intubated and placed under involuntary commitment.  She was seen by consultation by Dr. Toni Amendlapacs on September 16, 2014.  It appears she gave Dr. Toni Amendlapacs similar information that she simply passed out on the floor.    PAST PSYCHIATRIC HISTORY:  The patient states that she saw a therapist about [facility] years ago [facility].  She states that this was related to seeing Dr. Fernanda Drumoundtree [facility].  She states that there she was been getting her pain medications.  She indicated the therapy was more a requirement to be in the pain management program.  She stated that somehow the therapist and her had resolved that one of her major stressors was interacting with her mother.  The patient indicates that the conversation she had with her mother immediately prior to this admission was the first conversation she had had with her mother in one year.  She denies any past psychiatric hospitalizations.  She denies any past suicide attempts.    PAST MEDICAL HISTORY:  She states she has hypertension and "anxiety."  MEDICATIONS:  Her current medications on transfer were alprazolam 0.5 mg every 8 hours as needed for anxiety and acetaminophen  650 mg q. 4 hours.  The medications prior to her admission were oxymorphone 5 mg twice daily, Lyrica 100 mg three times daily, nortriptyline 20 mg daily, trazodone 100 mg at bedtime, Zyrtec 10 mg daily, alprazolam 2 mg three times daily as needed, atenolol 50 mg twice daily, baclofen 10 mg at least once daily and Tizanidine 4 mg every 8 hours as needed for pain, Detrol LA 2 mg once a day.  The patient had informed Dr. Toni Amend that the only medication she takes are her hydrocodone, trazodone and blood pressure medications.  She indicates her prescribed  dose of Xanax was 1 mg three times a day.    ALLERGIES:  KENALOG.  FAMILY HISTORY OF PSYCHIATRIC ILLNESS:  The patient denies any known family history of psychiatric illness.    SOCIAL HISTORY:  The patient lives with her husband of 9 years.  She states she has known him for 13 years.  She indicates he is 11 years her senior.  She states she has 2 sons from a prior marriage.  She has 1 stepdaughter in this current marriage.  She attended 2 years of college and was studying stenography.  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  She had also worked for [employer].    REVIEW OF SYSTEMS:  The patient endorses some numbness and weakness of her right foot.  She indicates this has been the case since she has been admitted to the hospital.   NEUROLOGIC:  She had a headache this morning that responded to ibuprofen.  She denies any blurry vision, double vision.   CARDIOVASCULAR:  She denies any chest pain, shortness of breath.   GASTROINTESTINAL:  She endorses some diarrhea.  PHYSICAL EXAMINATION:  The patient is a middle-age female appearing her stated age.  She is dressed in a sweat shirt and hospital scrubs.  She has fair grooming.  She makes good eye contact.  There are no abnormal movements.  She is in a wheelchair secondary to her complaints of the right foot weakness.  Her speech is normal rate and volume.   VITAL SIGNS:  Her pulse is 52; her blood pressure is 161/82.  Her temperature is 98.5.  Her respiration rate 18.  The patient had good hip flexion in both right and left lower extremities.  She had good dorsi and plantar flexion in her left foot.  She did not move her right foot.  She had good dorsalis pedis pulses in both feet bilaterally.  She denied any pain in her foot but only reported numbness and lack of sensation.    LABORATORY RESULTS:  The patient's labs on December 18 were significant for an  elevated blood glucose of 173 and AST that was elevated at 159, slightly mild decreased albumin at 2.9.  Her complete blood count was significant for elevated white count at 13.2 and an elevated hemoglobin of 17.1 and elevated hematocrit of 52.  The patient had a head CT scan on December 17, the impression was normal brain with some right maxillary sinus opacification.  She had a chest x-ray on December 18, which was documenting the removal of the NG tube.  It noted not active cardiopulmonary disease.  She had had an abdominal ultrasound on September 17, 2014, it was noted to have no focal lesion identified in the gallbladder.    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    ASSESSMENT AND PLAN:  The patient presents a story of simply becoming unconscious and not engaging in an active suicide attempt.  However, there is collateral in the documentation that indicates she verbally told people she would take all of her pills.  Given this we will have to continue to observe and obtain additional collateral.  We will re-order her liver function tests to assess the abnormalities.  We will also order a CBC to check for the elevations.  I will send her for an x-ray of her right foot to assess for any trauma.  We will restart her outpatient blood pressure medication as she had some elevated blood pressure earlier today, thus, we will restart her atenolol 50 mg twice daily.  We will also continue her Cymbalta that was started in the CCU as she reported that this was somewhat beneficial to her for pain and may also be helpful for depression.  We will add trazodone p.r.n. as she relates she was using this medication prior to admission.     ____________________________ Loralie Champagne Mayford Knife, MD alw:at D: 09/18/2014 12:17:41 ET T: 09/18/2014 14:43:12 ET JOB#: [id number]  cc: Leory Plowman L.  Mayford Knife, MD, <Dictator> Kerin Salen MD ELECTRONICALLY SIGNED 09/29/2014 11:57

## 2015-03-23 ENCOUNTER — Telehealth: Payer: Self-pay | Admitting: Family Medicine

## 2015-03-24 ENCOUNTER — Other Ambulatory Visit: Payer: Self-pay | Admitting: Family Medicine

## 2015-03-27 NOTE — Telephone Encounter (Signed)
Med called into pharmacy. Pt informed. bb

## 2015-03-27 NOTE — Telephone Encounter (Signed)
OK to call in Refill alprazolam 2mg  one every eight hours as needed, #90, rf x 1. Thanks.

## 2015-03-27 NOTE — Telephone Encounter (Signed)
Pt contacted office for refill request on the following medications: Xanax 2 mg Thanks TNP

## 2015-04-11 ENCOUNTER — Other Ambulatory Visit: Payer: Self-pay

## 2015-04-11 ENCOUNTER — Telehealth: Payer: Self-pay | Admitting: Family Medicine

## 2015-04-11 DIAGNOSIS — F419 Anxiety disorder, unspecified: Secondary | ICD-10-CM | POA: Insufficient documentation

## 2015-04-11 NOTE — Telephone Encounter (Signed)
Per [employer] med list, patient is taking Atenolol  twice daily.

## 2015-04-11 NOTE — Telephone Encounter (Signed)
Pt called saying [facility] said you wrote the refill for her RX for atenolol 50 mg for 30 days, but she needs a 90 day supply.  Also she said she is suppose to be taking bid.  Please call her back and clarify.  [phone]  .  Thanks, TP

## 2015-04-12 ENCOUNTER — Other Ambulatory Visit: Payer: Self-pay | Admitting: Family Medicine

## 2015-04-12 MED ORDER — ATENOLOL 50 MG PO TABS: 50.0000 mg | ORAL_TABLET | Freq: Two times a day (BID) | ORAL | Status: DC

## 2015-04-20 ENCOUNTER — Telehealth: Payer: Self-pay | Admitting: Family Medicine

## 2015-04-20 NOTE — Telephone Encounter (Signed)
Pt says she is having problems with her finger tips going numb.  She thinks the medication is not helping anymore.  Could she try some thing else?  Please call her back at [phone].

## 2015-04-20 NOTE — Telephone Encounter (Signed)
I don't know what medication she is talking about.

## 2015-04-21 NOTE — Telephone Encounter (Signed)
Patient stated that she was wanting to try a different medication in place of the nortriptyline. Pt feels that nortriptyline is no longer working.

## 2015-04-24 NOTE — Telephone Encounter (Signed)
Can try changing stopping  nortriptyline and starting topiramate, , 1  QHS for 7 days, then1 tablet twice a day for 7 days, then 2 twice a day. #60, rf x 0 follow up office visit 3 weeks.

## 2015-04-25 MED ORDER — TOPIRAMATE 25 MG PO TABS: ORAL_TABLET | ORAL | Status: DC

## 2015-04-25 NOTE — Telephone Encounter (Signed)
Patient notified. Rx sent to pharmacy. Appt scheduled.

## 2015-05-04 DIAGNOSIS — M51369 Other intervertebral disc degeneration, lumbar region without mention of lumbar back pain or lower extremity pain: Secondary | ICD-10-CM | POA: Insufficient documentation

## 2015-05-04 DIAGNOSIS — M5136 Other intervertebral disc degeneration, lumbar region: Secondary | ICD-10-CM | POA: Insufficient documentation

## 2015-05-04 DIAGNOSIS — G629 Polyneuropathy, unspecified: Secondary | ICD-10-CM | POA: Insufficient documentation

## 2015-05-04 DIAGNOSIS — T148XXA Other injury of unspecified body region, initial encounter: Secondary | ICD-10-CM | POA: Insufficient documentation

## 2015-05-04 DIAGNOSIS — M5430 Sciatica, unspecified side: Secondary | ICD-10-CM | POA: Insufficient documentation

## 2015-05-04 DIAGNOSIS — M5126 Other intervertebral disc displacement, lumbar region: Secondary | ICD-10-CM | POA: Insufficient documentation

## 2015-05-04 DIAGNOSIS — R609 Edema, unspecified: Secondary | ICD-10-CM | POA: Insufficient documentation

## 2015-05-05 ENCOUNTER — Ambulatory Visit (INDEPENDENT_AMBULATORY_CARE_PROVIDER_SITE_OTHER): Payer: Self-pay | Admitting: Family Medicine

## 2015-05-05 ENCOUNTER — Encounter: Payer: Self-pay | Admitting: Family Medicine

## 2015-05-05 VITALS — BP 130/76 | HR 63 | Temp 98.7°F | Resp 16 | Ht 66.0 in | Wt 163.0 lb

## 2015-05-05 DIAGNOSIS — G4700 Insomnia, unspecified: Secondary | ICD-10-CM

## 2015-05-05 DIAGNOSIS — G629 Polyneuropathy, unspecified: Secondary | ICD-10-CM

## 2015-05-05 MED ORDER — TRAZODONE HCL 100 MG PO TABS: ORAL_TABLET | ORAL | Status: DC

## 2015-05-05 NOTE — Patient Instructions (Signed)
   Increase topiramate to one tablet twice a day for 7 days, then   Increase topiramate to two tablets twice a day.

## 2015-05-05 NOTE — Progress Notes (Signed)
Patient: Marilyn Brown Female    DOB: [birth year]   44 y.o.   MRN: [id number] Visit Date: 05/05/2015  Today's Provider: Mila Merry, MD   Chief Complaint  Patient presents with  . Follow-up    Medication   Subjective:    HPI Follow-up for medication change; stopped nortriptyline 10 mg and started topiramate 25 mg x2 qd for numbness and stinging in the tips of her fingers. Nortriptyline had been working years, but not lately. She had adverse effects from medication. Her pain specialist has changed her from Lyrica to gabapentin due to cost. She states the pain in her fingers seems to get worse when she is cold, and sometimes notices tips of her fingers turn dark red.   She also states she has been having more trouble sleeping, trazodone use to be effective, but not anymore.     Allergies  Allergen Reactions  . Penicillins Swelling  . Triamcinolone Swelling  . Prednisone Anxiety    Patient had swelling    Previous Medications   ALPRAZOLAM (XANAX) 2 MG TABLET    TAKE 1 TABLET BY MOUTH EVERY 8 HOURS AS NEEDED   ATENOLOL (TENORMIN) 50 MG TABLET    Take 1 tablet (50 mg total) by mouth 2 (two) times daily.   CETIRIZINE (ZYRTEC) 10 MG TABLET    Take 10 mg by mouth daily.   GABAPENTIN (NEURONTIN) 600 MG TABLET    Take 600 mg by mouth 3 (three) times daily.   LEVONORGESTREL (MIRENA, 52 MG,) 20 MCG/24HR IUD    by Intrauterine route.   MAGNESIUM 200 MG TABS    Take 2 tablets by mouth at bedtime.   MORPHINE (MSIR) 15 MG TABLET    Take 15 mg by mouth 2 (two) times daily.   NORTRIPTYLINE (PAMELOR) 10 MG CAPSULE    Take 10 mg by mouth. Take 2 tablets once daily.   POTASSIUM GLUCONATE 595 MG TABS TABLET    Take 1 tablet by mouth daily.   PREGABALIN (LYRICA) 100 MG CAPSULE    Take 1 capsule by mouth 3 (three) times daily.   PREGABALIN (LYRICA) 50 MG CAPSULE    Take 50 mg by mouth 3 (three) times daily.   TOPIRAMATE (TOPAMAX) 25 MG TABLET    Take One Tablet PO QHS For 7 Days And Then 1  Tablet PO Twice A Day.   TRAZODONE (DESYREL) 100 MG TABLET    Take 1 tablet by mouth at bedtime.   VALERIAN 100 MG CAPS    Take 3 capsules by mouth at bedtime.    Review of Systems  Cardiovascular: Negative.   Neurological: Positive for headaches. Negative for dizziness and light-headedness.    History  Substance Use Topics  . Smoking status: Current Every Day Smoker -- 0.50 packs/day for 22 years  . Smokeless tobacco: Not on file     Comment: smokes <1 pack of cigarettes per day, has been smoking for 22 years  . Alcohol Use: Yes     Comment: moderate alcohol use   Objective:   BP 130/76 mmHg  Pulse 63  Temp(Src) 98.7 F (37.1 C) (Oral)  Resp 16  Ht  (1.676 m)  Wt 163 lb (73.936 kg)  BMI 26.32 kg/m2  SpO2 97%  Physical Exam  General appearance: alert, well developed, well nourished, cooperative and in no distress Head: Normocephalic, without obvious abnormality, atraumatic Lungs: Respirations even and unlabored Extremities: No gross deformities, normal pulses and normal capillary  refill.  Skin: Skin color, texture, turgor normal. No rashes seen  Psych: Appropriate mood and affect. Neurologic: Mental status: Alert, oriented to person, place, and time, thought content appropriate.     Assessment & Plan:     1. Insomnia Previously did well on trazodone, will titrate up dose as needed.  - traZODone (DESYREL) 100 MG tablet; Up to 2 tablets at bedtime  Dispense: 60 tablet  2. Neuropathy  Is only taking 25mg  a day of topiramate, with slight improvement in symptoms. Will titrate up to 50mg  twice a day.   Symptoms could be consistent with Reynaud's. Consider CCB if topiramate not effective.       Mila Merry, MD  [facility]

## 2015-05-27 ENCOUNTER — Other Ambulatory Visit: Payer: Self-pay | Admitting: Family Medicine

## 2015-06-08 ENCOUNTER — Telehealth: Payer: Self-pay | Admitting: Family Medicine

## 2015-06-08 NOTE — Telephone Encounter (Signed)
Pt stated that even taking 2 traZODone (DESYREL) 100 MG tablet a night she still is only sleeping 4 hours a night. Pt wanted to know if she should try something different. Please advise. Thanks TNP

## 2015-06-09 MED ORDER — SUVOREXANT 10 MG PO TABS: 10.0000 mg | ORAL_TABLET | Freq: Every day | ORAL | Status: DC

## 2015-06-09 NOTE — Telephone Encounter (Signed)
Patient notified. Rx phoned in to [facility] [address].

## 2015-06-09 NOTE — Telephone Encounter (Signed)
LMOVM for pt to return call 

## 2015-06-09 NOTE — Telephone Encounter (Signed)
Try belsomra  one tablet at bedtime, #30,rf x 1. Needs to be called in. D/c trazodone.

## 2015-06-09 NOTE — Telephone Encounter (Signed)
Pt returned you call,  Thanks, Barth Kirks

## 2015-06-12 ENCOUNTER — Telehealth: Payer: Self-pay | Admitting: Family Medicine

## 2015-06-12 MED ORDER — ZOLPIDEM TARTRATE 5 MG PO TABS: 5.0000 mg | ORAL_TABLET | Freq: Every evening | ORAL | Status: DC | PRN

## 2015-06-12 NOTE — Telephone Encounter (Signed)
Patient called requesting a different medication then Belsorma. The patient said even with her discount card the cost was $300. The patient said that she does not have insurance. Patient is requesting a less expensive medication?

## 2015-06-12 NOTE — Telephone Encounter (Signed)
Please call in zolpidem  

## 2015-06-12 NOTE — Telephone Encounter (Signed)
Patient notified. Rx called into pharmacy.  

## 2015-06-12 NOTE — Telephone Encounter (Signed)
Pt is requesting a call back from [place] to discuss a medication.  IO#[phone]

## 2015-06-20 ENCOUNTER — Telehealth: Payer: Self-pay

## 2015-06-20 DIAGNOSIS — I10 Essential (primary) hypertension: Secondary | ICD-10-CM

## 2015-06-20 MED ORDER — LISINOPRIL 10 MG PO TABS: 10.0000 mg | ORAL_TABLET | Freq: Every day | ORAL | Status: DC

## 2015-06-20 NOTE — Telephone Encounter (Signed)
Spoke with Dr. Sherrie Mustache regarding pt's concerns; Dr. Georgina Pillion for pt to take 1/2 pill of atenolol and to recheck her B/P an hr after.  Pt was advised as directed. Pt was also advised to call if any questions or concerns.  Thanks,

## 2015-06-20 NOTE — Telephone Encounter (Signed)
Recommend she start lisinopril  one tablet daily, #30, rf x 0. Follow up o.v. 10-14 days.

## 2015-06-20 NOTE — Telephone Encounter (Signed)
Advised pt as directed below. Pt verbalized fully understanding. Scheduled pt on Sept 30 th at 04:15 pm for a B/P follow-up.  Sent prescription to pharmacy as ordered.  Thanks,

## 2015-06-20 NOTE — Telephone Encounter (Signed)
Pt concerned for B/P readings 164/109 this am, she stated that she had taken atenolol 50 mg at 4:30 this am. Which she takes BID. She said that she has a headache and feels a little dizzy. She stated that she can not come for an ov nor for a B/P check due to being paralyzed and being by herself. She wants to know if she can take a 1/2 pill of the atenolol 50 mg this am for her b/p.  Please advise,

## 2015-06-20 NOTE — Telephone Encounter (Signed)
Pt stated that her B/P decreased from 164/109 from earlier this am, after taking the 1/2 pill of atenolol 50 mg okayed per MD's order. Pt also stated that she wants to know if her b/p medications need to be adjusted since she has been noticing her b/p reading climbing up. Please advise if pt needs a ov for f/u.  Thanks,

## 2015-06-23 ENCOUNTER — Other Ambulatory Visit: Payer: Self-pay | Admitting: Family Medicine

## 2015-06-23 NOTE — Telephone Encounter (Signed)
Pt contacted office for refill request on the following medications:  alprazolam (XANAX) 2 MG.  [facility] [address]  ZO#[phone]

## 2015-06-25 MED ORDER — ALPRAZOLAM 2 MG PO TABS: 2.0000 mg | ORAL_TABLET | Freq: Three times a day (TID) | ORAL | Status: DC | PRN

## 2015-06-25 NOTE — Telephone Encounter (Signed)
Please call in alprazolam.  

## 2015-06-26 NOTE — Telephone Encounter (Signed)
Prescription called into pharmacy. Patient advised.  

## 2015-06-28 ENCOUNTER — Telehealth: Payer: Self-pay | Admitting: Family Medicine

## 2015-06-28 NOTE — Telephone Encounter (Signed)
Pt says her blood pressure medication is making her feel funny, off balance and her blood pressure has been low.  Please advise  Call back is [phone]  Thanks, Barth Kirks

## 2015-06-28 NOTE — Telephone Encounter (Signed)
She can reduce lisinopril to 1/2 tablet daily

## 2015-06-28 NOTE — Telephone Encounter (Signed)
Returned call to pt. Patient started taking lisinopril at night. Patient stated that her BP has been low 94/60 with pulse down to 54. Patient said that she has been feeling tried and sleepy since she started lisinopril.

## 2015-06-28 NOTE — Telephone Encounter (Signed)
Patient notified. Expressed understanding.

## 2015-06-30 ENCOUNTER — Ambulatory Visit: Payer: Self-pay | Admitting: Family Medicine

## 2015-07-07 ENCOUNTER — Encounter: Payer: Self-pay | Admitting: Family Medicine

## 2015-07-07 ENCOUNTER — Ambulatory Visit (INDEPENDENT_AMBULATORY_CARE_PROVIDER_SITE_OTHER): Payer: Self-pay | Admitting: Family Medicine

## 2015-07-07 VITALS — BP 122/68 | HR 63 | Temp 98.9°F | Resp 16 | Ht 64.25 in | Wt 153.0 lb

## 2015-07-07 DIAGNOSIS — I10 Essential (primary) hypertension: Secondary | ICD-10-CM

## 2015-07-07 NOTE — Progress Notes (Signed)
Patient: Marilyn Brown Female    DOB: [birth year]   44 y.o.   MRN: [id number] Visit Date: 07/07/2015  Today's Provider: Mila Merry, MD   Chief Complaint  Patient presents with  . Hypertension    follow up   Subjective:    HPI  Hypertension, follow-up:  BP Readings from Last 3 Encounters:  05/05/15 130/76    She was last seen for hypertension  2 months  ago. Since that visit patient was started on Lisinopril  daily on 06/20/2015 due to elevated blood pressures at home. She had been taking atenolol, but stopped atenolol when she started lisinopril, although she was not told to do so. Patient states she started to feel swimmy headed and woozy on that dose and was told to take 1/2 tablet of Lisinopril  daily. Patient states she has been taking 1/2 a pill daily and symptoms have resolved. Patient states her blood pressure has been high running: Systolic reading140-150   Diastolic reading 82-956. Patient states her blood pressure was high today at 150/103 so she to 1/2 tablet of Atenolol  to try to lower her blood pressure.  She reports excellent compliance with treatment. She is having side effects. Elevated blood pressure.  She is not exercising. She is adherent to low salt diet.   She is experiencing none.  Patient denies chest pain, chest pressure/discomfort, claudication, dyspnea, exertional chest pressure/discomfort, fatigue, irregular heart beat, lower extremity edema, near-syncope, orthopnea, palpitations, paroxysmal nocturnal dyspnea, syncope and tachypnea.   Cardiovascular risk factors include smoking/ tobacco exposure.  Use of agents associated with hypertension: none.     Weight trend: decreasing steadily Wt Readings from Last 3 Encounters:  05/05/15 163 lb (73.936 kg)    Current diet: in general, a "healthy" diet    ------------------------------------------------------------------------      Allergies  Allergen Reactions  . Triamcinolone  Swelling  . Prednisone Anxiety    Patient had swelling    Previous Medications   ALPRAZOLAM (XANAX) 2 MG TABLET    Take 1 tablet (2 mg total) by mouth every 8 (eight) hours as needed.   ATENOLOL (TENORMIN) 50 MG TABLET    Take 1 tablet (50 mg total) by mouth 2 (two) times daily.   BACLOFEN (LIORESAL) 10 MG TABLET    Take 10 mg by mouth every 6 (six) hours as needed. As needed for muscle spasms   CETIRIZINE (ZYRTEC) 10 MG TABLET    Take 10 mg by mouth daily.   GABAPENTIN (NEURONTIN) 600 MG TABLET    Take 600 mg by mouth 3 (three) times daily.   LEVONORGESTREL (MIRENA, 52 MG,) 20 MCG/24HR IUD    by Intrauterine route.   LISINOPRIL (PRINIVIL,ZESTRIL) 10 MG TABLET    Take 1 tablet (10 mg total) by mouth daily.   OXYCODONE (OXY IR/ROXICODONE) 5 MG IMMEDIATE RELEASE TABLET    Take 5 mg by mouth every 6 (six) hours as needed.   POTASSIUM GLUCONATE 595 MG TABS TABLET    Take 1 tablet by mouth daily.   SUVOREXANT (BELSOMRA) 10 MG TABS    Take 10 mg by mouth at bedtime.   TOPIRAMATE (TOPAMAX) 25 MG TABLET    Take 1 tablet (25 mg total) by mouth 2 (two) times daily.   ZOLPIDEM (AMBIEN) 5 MG TABLET    Take 1 tablet (5 mg total) by mouth at bedtime as needed for sleep.    Review of Systems  Constitutional: Negative for fever, chills, appetite change  and fatigue.  Respiratory: Negative for chest tightness and shortness of breath.   Cardiovascular: Negative for chest pain and palpitations.  Gastrointestinal: Negative for nausea, vomiting and abdominal pain.  Neurological: Positive for headaches. Negative for dizziness and weakness.  Psychiatric/Behavioral: Positive for dysphoric mood.    Social History  Substance Use Topics  . Smoking status: Current Every Day Smoker -- 0.50 packs/day for 22 years  . Smokeless tobacco: Not on file     Comment: smokes <1 pack of cigarettes per day, has been smoking for 22 years  . Alcohol Use: 0.0 oz/week    0 Standard drinks or equivalent per week     Comment:  moderate alcohol use   Objective:   BP 122/68 mmHg  Pulse 63  Temp(Src) 98.9 F (37.2 C) (Oral)  Resp 16  Ht 5' 4.25" (1.632 m)  Wt 153 lb (69.4 kg)  BMI 26.06 kg/m2  SpO2 98%  Physical Exam  General Appearance:    Alert, cooperative, no distress  Eyes:    PERRL, conjunctiva/corneas clear, EOM's intact       Lungs:     Clear to auscultation bilaterally, respirations unlabored  Heart:    Regular rate and rhythm  Neurologic:   Awake, alert, oriented x 3. No apparent focal neurological           defect.            Assessment & Plan:     1. Essential hypertension Improved since starting lisinopril, but advised she is supposed to continue atenolol which she also takes to suppress headaches. She is to follow up in 2 months.         Mila Merry, MD  [facility]

## 2015-07-20 ENCOUNTER — Other Ambulatory Visit: Payer: Self-pay | Admitting: *Deleted

## 2015-07-20 NOTE — Telephone Encounter (Signed)
Please advise refill for Fioricet?

## 2015-07-21 MED ORDER — BUTALBITAL-APAP-CAFFEINE 50-325-40 MG PO TABS: 1.0000 | ORAL_TABLET | Freq: Every day | ORAL | Status: DC | PRN

## 2015-07-21 NOTE — Telephone Encounter (Signed)
Rx called in to pharmacy. 

## 2015-07-21 NOTE — Telephone Encounter (Signed)
Please call in Fioricet.  

## 2015-07-26 ENCOUNTER — Telehealth: Payer: Self-pay

## 2015-07-26 DIAGNOSIS — I10 Essential (primary) hypertension: Secondary | ICD-10-CM

## 2015-07-26 NOTE — Telephone Encounter (Signed)
Patient also wanted to add that she has been having chest pain that radiates to her left arm and upper left side of her back. She rates her headache a 10 on the pain scale. Reccommended to patient that she go to the ER to be evaluated. Patient agrees to treatment plan.

## 2015-07-26 NOTE — Telephone Encounter (Signed)
Patient called saying that her BP has been elevated lately. She reports that she has developed a headache today, but denies any blurred vision, chest pain, or shortness of breath. She reports that she checked her BP and it was 149/88 and she took 1/2 tablet of Atenolol (which is 25mg ) and rechecked it and her BP was 166/93. Patient wanted to know what should she do to help lower her BP? Please advise. Thanks!

## 2015-07-28 MED ORDER — AMLODIPINE BESYLATE 5 MG PO TABS: 5.0000 mg | ORAL_TABLET | Freq: Every day | ORAL | Status: DC

## 2015-07-28 NOTE — Telephone Encounter (Signed)
Patient advised and agrees to try Amlodipine. Prescription sent into pharmacy. Medication changes updated in patient chart.

## 2015-07-28 NOTE — Telephone Encounter (Signed)
Tried calling patient. No answer. Left message to call back.  

## 2015-07-28 NOTE — Telephone Encounter (Signed)
Patient called back this morning crying stating no one has called her back from when she left a message 2 days ago. Patient was advised that she was told to go to the ER 2 days ago. Patient states she went to the ER yesterday [facility] Upon arrival, her blood pressure was 160/93. She states they were able to bring it down to 102/53. She states she does not want to take the Lisinopril because it is not helping to keep her blood pressure down. Patient states the Atenolol works better for her but her blood pressure doesn't stay down. Patient has only been taking Atenolol 50mg  twice daily. She has not been taking the Lisinopril as directed at mid day because it does not help. Patient wants to adjust the dosage of Atenolol so that it can better control her blood pressure. Patient [facility] to follow up with her PCP, but she states she does not have the money to come in for an office visit. Patient would like a call back within the next hour. [facility], so she will be leaving home around 12:30-1pm. Please call patient back and advise. [phone].  [facility] [address]

## 2015-07-28 NOTE — Telephone Encounter (Signed)
Can change lisinopril to amlodipine 5mg  one tablet daily, #30, rf x 1. The maximum dose of atenolol is 100mg  a day (50mg  twice a day) which she is already taking.

## 2015-07-31 ENCOUNTER — Telehealth: Payer: Self-pay | Admitting: Family Medicine

## 2015-07-31 NOTE — Telephone Encounter (Signed)
Ok to take extra 1/2 tablet of atenolol as needed if BP >160/100. BP should come time over the next week since starting the amlodipine on Friday.

## 2015-07-31 NOTE — Telephone Encounter (Signed)
Pt's husband called saying his wifes blood pressure has been running 160/100.    Please advise.    Call back is    [phone]  Thanks Barth Kirkseri

## 2015-07-31 NOTE — Telephone Encounter (Signed)
Patient stated that her bp did not come down after starting the amlodipine 5 mg. Patient stated that all weekend her bp stayed elevated avg 160/90. Patient stated that this morning her bp was 169/101. She took atenolol 50 mg tablet and her bp came down to 127/63. Patient is requesting to stay on atenolol 50 mg x2 qd, with the option of taking 1/2 tablet extra during the day prn.

## 2015-07-31 NOTE — Telephone Encounter (Signed)
Returned call. Was told that this was a wrong number. Left vm on pt's phone.

## 2015-07-31 NOTE — Telephone Encounter (Signed)
Patient notified. Patient expressed understanding.  

## 2015-08-01 ENCOUNTER — Telehealth: Payer: Self-pay

## 2015-08-01 NOTE — Telephone Encounter (Signed)
Patient called stating she was returning Michelle's call. I looked in her chart and did not see any open messages. Patient states she wanted to talk about her blood pressure and migraines. Patient states she has been checking her blood pressure several times a day and her blood pressure was elevated yesterday at 160/90 range. This morning her blood pressure was in the  123/64 range. Patient states she has been taking only the Atenolol twice daily and 1/2 of an Atenolol mid day. I advised patient that per Dr. Theodis AguasFisher's recommendation's she should be taking Atenolol twice daily and 1 pill of Amlodipine daily. The 1/2 pill of Atenolol should only be taken as needed if blood pressure is greater than 160/100. Patient got upset and  York SpanielSaid that is not what Marcelino DusterMichelle told her. Patient states we are giving her mixed messages. Patient states she is tired of this. Patient goes on to say that she is going to find her a new doctor, then hangs the phone up.

## 2015-08-02 NOTE — Telephone Encounter (Signed)
Patient notified. Patient expressed understanding.  

## 2015-08-02 NOTE — Telephone Encounter (Signed)
Patient called back today to talk about her bp. Patient stated that she is taking her bp about every two hours, readings are averaging around 160/98 . Patient wanted to know if she should increase amlodipine to 2 pills qd. Advised pt to follow Dr. Esmond CamperFishers instructions from yesterday and reschedule her ov appt to come in sooner to discuss with provider. Patient stated that she will think about changing her appt that is on 08/11/2015 to a sooner date.

## 2015-08-02 NOTE — Telephone Encounter (Signed)
OK to take 2 x amlodipine 5mg  per day. Keep in mind that BP is not dangerous in the short term unless it stays above 180/120. The more often she checks it, the higher it will get. It's best to only check it once or twice a day.

## 2015-08-04 ENCOUNTER — Ambulatory Visit: Payer: Self-pay | Admitting: Family Medicine

## 2015-08-04 ENCOUNTER — Encounter: Payer: Self-pay | Admitting: Family Medicine

## 2015-08-04 ENCOUNTER — Ambulatory Visit (INDEPENDENT_AMBULATORY_CARE_PROVIDER_SITE_OTHER): Payer: Self-pay | Admitting: Family Medicine

## 2015-08-04 VITALS — BP 132/70 | HR 54 | Temp 98.3°F | Resp 16 | Ht 64.25 in | Wt 141.0 lb

## 2015-08-04 DIAGNOSIS — F329 Major depressive disorder, single episode, unspecified: Secondary | ICD-10-CM

## 2015-08-04 DIAGNOSIS — F419 Anxiety disorder, unspecified: Secondary | ICD-10-CM

## 2015-08-04 DIAGNOSIS — Z79899 Other long term (current) drug therapy: Secondary | ICD-10-CM

## 2015-08-04 DIAGNOSIS — I10 Essential (primary) hypertension: Secondary | ICD-10-CM

## 2015-08-04 DIAGNOSIS — F32A Depression, unspecified: Secondary | ICD-10-CM

## 2015-08-04 MED ORDER — SERTRALINE HCL 50 MG PO TABS: 25.0000 mg | ORAL_TABLET | Freq: Every day | ORAL | Status: DC

## 2015-08-04 NOTE — Patient Instructions (Signed)
Start sertraline 1/2 tablet a day for 6 days, then increase to 1 full tablet a day. You may increase to 2 tablets a day after another week if needed.

## 2015-08-04 NOTE — Progress Notes (Signed)
Patient: Marilyn MoatFrances G Brown Female    DOB: 05-Feb-1971   44 y.o.   MRN: [id number] Visit Date: 08/04/2015  Today's Provider: Mila Merryonald Shawn Dannenberg, MD   Chief Complaint  Patient presents with  . Follow-up  . Hypertension   Subjective:    HPI   Hypertension, follow-up:  BP Readings from Last 3 Encounters:  08/04/15 132/70  07/07/15 122/68  05/05/15 130/76    She was last seen for hypertension 1 months ago.  BP at that visit was 122/68. She had been checking BP several times a day since then with many readings in the 160s/100s. She has since gone up to 2 a day of 5mg  amlodipine and an extra 1/2 tablet of atenolol. She states she has been very anxious about her blood pressure and general health the last few weeks. [facility] and states that her gabepentan was increased to 900 + 900 + 1200mg .     Weight trend: stable Wt Readings from Last 3 Encounters:  08/04/15 141 lb (63.957 kg)  07/07/15 153 lb (69.4 kg)  05/05/15 163 lb (73.936 kg)    Current diet: in general, an "unhealthy" diet  ------------------------------------------------------------------------    Allergies  Allergen Reactions  . Triamcinolone Swelling  . Prednisone Anxiety    Patient had swelling    Previous Medications   ALPRAZOLAM (XANAX) 2 MG TABLET    Take 1 tablet (2 mg total) by mouth every 8 (eight) hours as needed.   AMLODIPINE (NORVASC) 5 MG TABLET    Take 1 tablet (5 mg total) by mouth daily.   ATENOLOL (TENORMIN) 50 MG TABLET    Take 1 tablet (50 mg total) by mouth 2 (two) times daily.   BACLOFEN (LIORESAL) 10 MG TABLET    Take 10 mg by mouth every 6 (six) hours as needed. As needed for muscle spasms   BUTALBITAL-ACETAMINOPHEN-CAFFEINE (FIORICET, ESGIC) 50-325-40 MG TABLET    Take 1 tablet by mouth daily as needed for headache.   CETIRIZINE (ZYRTEC) 10 MG TABLET    Take 10 mg by mouth daily.   GABAPENTIN (NEURONTIN) 600 MG TABLET    Take 600 mg by mouth. Taking  900 mg in the morning, 900 mg in the evening, and 1200 mg at bedtime.   LEVONORGESTREL (MIRENA, 52 MG,) 20 MCG/24HR IUD    by Intrauterine route.   OXYBUTYNIN (DITROPAN XL) 15 MG 24 HR TABLET    Take 15 mg by mouth at bedtime.   OXYCODONE (OXY IR/ROXICODONE) 5 MG IMMEDIATE RELEASE TABLET    Take 5 mg by mouth every 6 (six) hours as needed.   POTASSIUM GLUCONATE 595 MG TABS TABLET    Take 1 tablet by mouth daily.   TOPIRAMATE (TOPAMAX) 25 MG TABLET    Take 1 tablet (25 mg total) by mouth 2 (two) times daily.    Review of Systems  Cardiovascular: Positive for chest pain and palpitations.  Neurological: Positive for dizziness, light-headedness and headaches.  Psychiatric/Behavioral: Positive for sleep disturbance. The patient is nervous/anxious.        Irritability     Social History  Substance Use Topics  . Smoking status: Current Every Day Smoker -- 0.50 packs/day for 22 years  . Smokeless tobacco: Not on file     Comment: smokes <1 pack of cigarettes per day, has been smoking for 22 years  . Alcohol Use: 0.0 oz/week    0 Standard drinks or equivalent per  week     Comment: moderate alcohol use   Objective:   BP 132/70 mmHg  Pulse 54  Temp(Src) 98.3 F (36.8 C) (Oral)  Resp 16  Ht 5' 4.25" (1.632 m)  Wt 141 lb (63.957 kg)  BMI 24.01 kg/m2  SpO2 97%  Physical Exam   General Appearance:    Alert, cooperative, no distress  Eyes:    PERRL, conjunctiva/corneas clear, EOM's intact       Lungs:     Clear to auscultation bilaterally, respirations unlabored  Heart:  bradycardic  Neurologic:   Awake, alert, oriented x 3. No apparent focal neurological           defect. Depressed affect          Assessment & Plan:     1. Anxiety Worsening. Was previously on SSRI which I think would be beneficial as below.   2. Essential hypertension Fairly well controlled on current regiment. I suspect this is being exacerbated by anxiety and will hopefully improve when anxiety is controlled.     3. Depression Start 1/2 tab sertraline and titrate as able to 2 tablets a day. She still has some left at home from previous prescription.  - sertraline (ZOLOFT) 50 MG tablet; Take 0.5-2 tablets (25-100 mg total) by mouth daily.  Dispense: 30 tablet; Refill: 3        Mila Merry, MD  [facility]

## 2015-08-10 ENCOUNTER — Telehealth: Payer: Self-pay | Admitting: Family Medicine

## 2015-08-10 NOTE — Telephone Encounter (Signed)
LMOVM on vm for pt to return call.  

## 2015-08-10 NOTE — Telephone Encounter (Signed)
Please check with patient to see how she is doing with anxiety since we started her on sertraline last week. We can increase to 100mg   Tablets if she feels we need to.

## 2015-08-11 ENCOUNTER — Ambulatory Visit: Payer: Self-pay | Admitting: Family Medicine

## 2015-08-11 ENCOUNTER — Emergency Department

## 2015-08-11 ENCOUNTER — Encounter: Payer: Self-pay | Admitting: Emergency Medicine

## 2015-08-11 ENCOUNTER — Observation Stay
Admission: EM | Admit: 2015-08-11 | Discharge: 2015-08-15 | Disposition: A | Discharge status: Home / Self Care | Attending: Internal Medicine | Admitting: Internal Medicine

## 2015-08-11 DIAGNOSIS — R109 Unspecified abdominal pain: Secondary | ICD-10-CM

## 2015-08-11 DIAGNOSIS — R51 Headache: Secondary | ICD-10-CM | POA: Insufficient documentation

## 2015-08-11 DIAGNOSIS — R059 Cough, unspecified: Secondary | ICD-10-CM

## 2015-08-11 DIAGNOSIS — R531 Weakness: Secondary | ICD-10-CM | POA: Insufficient documentation

## 2015-08-11 DIAGNOSIS — M5417 Radiculopathy, lumbosacral region: Secondary | ICD-10-CM | POA: Insufficient documentation

## 2015-08-11 DIAGNOSIS — W19XXXA Unspecified fall, initial encounter: Secondary | ICD-10-CM | POA: Insufficient documentation

## 2015-08-11 DIAGNOSIS — Z818 Family history of other mental and behavioral disorders: Secondary | ICD-10-CM | POA: Insufficient documentation

## 2015-08-11 DIAGNOSIS — J309 Allergic rhinitis, unspecified: Secondary | ICD-10-CM | POA: Insufficient documentation

## 2015-08-11 DIAGNOSIS — G9340 Encephalopathy, unspecified: Principal | ICD-10-CM | POA: Insufficient documentation

## 2015-08-11 DIAGNOSIS — I10 Essential (primary) hypertension: Secondary | ICD-10-CM | POA: Insufficient documentation

## 2015-08-11 DIAGNOSIS — R1031 Right lower quadrant pain: Secondary | ICD-10-CM | POA: Insufficient documentation

## 2015-08-11 DIAGNOSIS — E876 Hypokalemia: Secondary | ICD-10-CM | POA: Insufficient documentation

## 2015-08-11 DIAGNOSIS — Z79899 Other long term (current) drug therapy: Secondary | ICD-10-CM | POA: Insufficient documentation

## 2015-08-11 DIAGNOSIS — M5126 Other intervertebral disc displacement, lumbar region: Secondary | ICD-10-CM | POA: Insufficient documentation

## 2015-08-11 DIAGNOSIS — S0101XA Laceration without foreign body of scalp, initial encounter: Secondary | ICD-10-CM | POA: Insufficient documentation

## 2015-08-11 DIAGNOSIS — G629 Polyneuropathy, unspecified: Secondary | ICD-10-CM | POA: Insufficient documentation

## 2015-08-11 DIAGNOSIS — N951 Menopausal and female climacteric states: Secondary | ICD-10-CM | POA: Insufficient documentation

## 2015-08-11 DIAGNOSIS — G8929 Other chronic pain: Secondary | ICD-10-CM | POA: Insufficient documentation

## 2015-08-11 DIAGNOSIS — Z8249 Family history of ischemic heart disease and other diseases of the circulatory system: Secondary | ICD-10-CM | POA: Insufficient documentation

## 2015-08-11 DIAGNOSIS — K760 Fatty (change of) liver, not elsewhere classified: Secondary | ICD-10-CM | POA: Insufficient documentation

## 2015-08-11 DIAGNOSIS — R1084 Generalized abdominal pain: Secondary | ICD-10-CM | POA: Insufficient documentation

## 2015-08-11 DIAGNOSIS — N879 Dysplasia of cervix uteri, unspecified: Secondary | ICD-10-CM | POA: Insufficient documentation

## 2015-08-11 DIAGNOSIS — R4182 Altered mental status, unspecified: Secondary | ICD-10-CM | POA: Diagnosis present

## 2015-08-11 DIAGNOSIS — M542 Cervicalgia: Secondary | ICD-10-CM | POA: Insufficient documentation

## 2015-08-11 DIAGNOSIS — G43909 Migraine, unspecified, not intractable, without status migrainosus: Secondary | ICD-10-CM | POA: Insufficient documentation

## 2015-08-11 DIAGNOSIS — F329 Major depressive disorder, single episode, unspecified: Secondary | ICD-10-CM | POA: Insufficient documentation

## 2015-08-11 DIAGNOSIS — F17210 Nicotine dependence, cigarettes, uncomplicated: Secondary | ICD-10-CM | POA: Insufficient documentation

## 2015-08-11 DIAGNOSIS — F17200 Nicotine dependence, unspecified, uncomplicated: Secondary | ICD-10-CM | POA: Insufficient documentation

## 2015-08-11 DIAGNOSIS — G4700 Insomnia, unspecified: Secondary | ICD-10-CM | POA: Insufficient documentation

## 2015-08-11 DIAGNOSIS — D649 Anemia, unspecified: Secondary | ICD-10-CM | POA: Insufficient documentation

## 2015-08-11 DIAGNOSIS — K922 Gastrointestinal hemorrhage, unspecified: Secondary | ICD-10-CM | POA: Insufficient documentation

## 2015-08-11 DIAGNOSIS — H6690 Otitis media, unspecified, unspecified ear: Secondary | ICD-10-CM | POA: Insufficient documentation

## 2015-08-11 DIAGNOSIS — E860 Dehydration: Secondary | ICD-10-CM | POA: Insufficient documentation

## 2015-08-11 DIAGNOSIS — M21379 Foot drop, unspecified foot: Secondary | ICD-10-CM | POA: Insufficient documentation

## 2015-08-11 DIAGNOSIS — S0191XA Laceration without foreign body of unspecified part of head, initial encounter: Secondary | ICD-10-CM | POA: Insufficient documentation

## 2015-08-11 DIAGNOSIS — M5137 Other intervertebral disc degeneration, lumbosacral region: Secondary | ICD-10-CM | POA: Insufficient documentation

## 2015-08-11 DIAGNOSIS — R05 Cough: Secondary | ICD-10-CM

## 2015-08-11 DIAGNOSIS — R410 Disorientation, unspecified: Secondary | ICD-10-CM

## 2015-08-11 DIAGNOSIS — F419 Anxiety disorder, unspecified: Secondary | ICD-10-CM | POA: Insufficient documentation

## 2015-08-11 DIAGNOSIS — Z888 Allergy status to other drugs, medicaments and biological substances status: Secondary | ICD-10-CM | POA: Insufficient documentation

## 2015-08-11 DIAGNOSIS — M5416 Radiculopathy, lumbar region: Secondary | ICD-10-CM | POA: Insufficient documentation

## 2015-08-11 DIAGNOSIS — R197 Diarrhea, unspecified: Secondary | ICD-10-CM | POA: Insufficient documentation

## 2015-08-11 HISTORY — DX: Major depressive disorder, single episode, unspecified: F32.9

## 2015-08-11 HISTORY — DX: Depression, unspecified: F32.A

## 2015-08-11 HISTORY — DX: Other symptoms and signs involving the musculoskeletal system: R29.898

## 2015-08-11 HISTORY — DX: Essential (primary) hypertension: I10

## 2015-08-11 LAB — URINALYSIS COMPLETE WITH MICROSCOPIC (ARMC ONLY)
BILIRUBIN URINE: NEGATIVE
Bacteria, UA: NONE SEEN
GLUCOSE, UA: NEGATIVE mg/dL
Leukocytes, UA: NEGATIVE
Nitrite: NEGATIVE
PH: 6 (ref 5.0–8.0)
Protein, ur: 100 mg/dL — AB
Specific Gravity, Urine: 1.025 (ref 1.005–1.030)

## 2015-08-11 LAB — CBC
HEMATOCRIT: 48.7 % — AB (ref 35.0–47.0)
HEMOGLOBIN: 16.3 g/dL — AB (ref 12.0–16.0)
MCH: 33.2 pg (ref 26.0–34.0)
MCHC: 33.4 g/dL (ref 32.0–36.0)
MCV: 99.5 fL (ref 80.0–100.0)
Platelets: 143 10*3/uL — ABNORMAL LOW (ref 150–440)
RBC: 4.89 MIL/uL (ref 3.80–5.20)
RDW: 12.7 % (ref 11.5–14.5)
WBC: 16.6 10*3/uL — AB (ref 3.6–11.0)

## 2015-08-11 LAB — COMPREHENSIVE METABOLIC PANEL
ALBUMIN: 4.7 g/dL (ref 3.5–5.0)
ALK PHOS: 61 U/L (ref 38–126)
ALT: 31 U/L (ref 14–54)
AST: 26 U/L (ref 15–41)
Anion gap: 10 (ref 5–15)
BILIRUBIN TOTAL: 2.1 mg/dL — AB (ref 0.3–1.2)
BUN: 33 mg/dL — AB (ref 6–20)
CALCIUM: 9.5 mg/dL (ref 8.9–10.3)
CO2: 24 mmol/L (ref 22–32)
Chloride: 104 mmol/L (ref 101–111)
Creatinine, Ser: 0.72 mg/dL (ref 0.44–1.00)
GFR calc Af Amer: >60 mL/min (ref >60)
GLUCOSE: 89 mg/dL (ref 65–99)
Potassium: 3.2 mmol/L — ABNORMAL LOW (ref 3.5–5.1)
Sodium: 138 mmol/L (ref 135–145)
TOTAL PROTEIN: 8 g/dL (ref 6.5–8.1)

## 2015-08-11 LAB — URINE DRUG SCREEN, QUALITATIVE (ARMC ONLY)
AMPHETAMINES, UR SCREEN: NOT DETECTED
BENZODIAZEPINE, UR SCRN: NOT DETECTED
Barbiturates, Ur Screen: NOT DETECTED
COCAINE METABOLITE, UR ~~LOC~~: NOT DETECTED
Cannabinoid 50 Ng, Ur ~~LOC~~: NOT DETECTED
MDMA (ECSTASY) UR SCREEN: NOT DETECTED
METHADONE SCREEN, URINE: NOT DETECTED
OPIATE, UR SCREEN: NOT DETECTED
Phencyclidine (PCP) Ur S: NOT DETECTED
Tricyclic, Ur Screen: NOT DETECTED

## 2015-08-11 LAB — TROPONIN I: Troponin I: <0.03 ng/mL (ref <0.031)

## 2015-08-11 MED ORDER — BUTALBITAL-APAP-CAFFEINE 50-325-40 MG PO TABS
1.0000 | ORAL_TABLET | Freq: Every day | ORAL | Status: DC | PRN
  Administered 2015-08-11: 1 via ORAL
  Filled 2015-08-11: qty 1

## 2015-08-11 MED ORDER — BACLOFEN 10 MG PO TABS: 10.0000 mg | ORAL_TABLET | Freq: Four times a day (QID) | ORAL | Status: DC | PRN

## 2015-08-11 MED ORDER — OXYBUTYNIN CHLORIDE ER 5 MG PO TB24
15.0000 mg | ORAL_TABLET | Freq: Every day | ORAL | Status: DC
  Administered 2015-08-11 – 2015-08-14 (×4): 15 mg via ORAL
  Filled 2015-08-11 (×4): qty 1

## 2015-08-11 MED ORDER — GABAPENTIN 400 MG PO CAPS: 1200.0000 mg | ORAL_CAPSULE | Freq: Every day | ORAL | Status: DC

## 2015-08-11 MED ORDER — CARBAMAZEPINE 200 MG PO TABS
200.0000 mg | ORAL_TABLET | Freq: Two times a day (BID) | ORAL | Status: DC
  Administered 2015-08-11 – 2015-08-15 (×9): 200 mg via ORAL
  Filled 2015-08-11 (×9): qty 1

## 2015-08-11 MED ORDER — TOPIRAMATE 25 MG PO TABS
25.0000 mg | ORAL_TABLET | Freq: Two times a day (BID) | ORAL | Status: DC
  Administered 2015-08-11 – 2015-08-15 (×8): 25 mg via ORAL
  Filled 2015-08-11 (×8): qty 1

## 2015-08-11 MED ORDER — ATENOLOL 50 MG PO TABS
50.0000 mg | ORAL_TABLET | Freq: Two times a day (BID) | ORAL | Status: DC
  Administered 2015-08-11 – 2015-08-15 (×8): 50 mg via ORAL
  Filled 2015-08-11 (×8): qty 1

## 2015-08-11 MED ORDER — AMLODIPINE BESYLATE 5 MG PO TABS
5.0000 mg | ORAL_TABLET | Freq: Every day | ORAL | Status: DC
  Administered 2015-08-11 – 2015-08-15 (×5): 5 mg via ORAL
  Filled 2015-08-11 (×5): qty 1

## 2015-08-11 MED ORDER — ONDANSETRON HCL 4 MG/2ML IJ SOLN: 4.0000 mg | Freq: Four times a day (QID) | INTRAMUSCULAR | Status: DC | PRN

## 2015-08-11 MED ORDER — ENOXAPARIN SODIUM 40 MG/0.4ML ~~LOC~~ SOLN
40.0000 mg | SUBCUTANEOUS | Status: DC
  Administered 2015-08-11 – 2015-08-12 (×2): 40 mg via SUBCUTANEOUS
  Filled 2015-08-11 (×2): qty 0.4

## 2015-08-11 MED ORDER — ALPRAZOLAM 0.5 MG PO TABS
2.0000 mg | ORAL_TABLET | Freq: Three times a day (TID) | ORAL | Status: DC | PRN
  Administered 2015-08-13: 2 mg via ORAL
  Filled 2015-08-11: qty 4

## 2015-08-11 MED ORDER — ONDANSETRON HCL 4 MG PO TABS: 4.0000 mg | ORAL_TABLET | Freq: Four times a day (QID) | ORAL | Status: DC | PRN

## 2015-08-11 MED ORDER — ACETAMINOPHEN 650 MG RE SUPP: 650.0000 mg | Freq: Four times a day (QID) | RECTAL | Status: DC | PRN

## 2015-08-11 MED ORDER — GABAPENTIN 300 MG PO CAPS
900.0000 mg | ORAL_CAPSULE | Freq: Two times a day (BID) | ORAL | Status: DC
Start: 2015-08-11 — End: 2015-08-13
  Administered 2015-08-11 – 2015-08-13 (×4): 900 mg via ORAL
  Filled 2015-08-11 (×4): qty 3

## 2015-08-11 MED ORDER — ACETAMINOPHEN 325 MG PO TABS
650.0000 mg | ORAL_TABLET | Freq: Four times a day (QID) | ORAL | Status: DC | PRN
  Administered 2015-08-12 – 2015-08-15 (×3): 650 mg via ORAL
  Filled 2015-08-11 (×3): qty 2

## 2015-08-11 MED ORDER — OXYCODONE HCL 5 MG PO TABS: 5.0000 mg | ORAL_TABLET | Freq: Four times a day (QID) | ORAL | Status: DC | PRN

## 2015-08-11 MED ORDER — DIAZEPAM 5 MG PO TABS
5.0000 mg | ORAL_TABLET | Freq: Three times a day (TID) | ORAL | Status: DC
  Administered 2015-08-11 – 2015-08-13 (×6): 5 mg via ORAL
  Filled 2015-08-11 (×6): qty 1

## 2015-08-11 NOTE — H&P (Signed)
[facility] [place] at [facility]   PATIENT NAME: Marilyn Brown    MR#:  [id number]  DATE OF BIRTH:  11-11-1970  DATE OF ADMISSION:  08/11/2015  PRIMARY CARE PHYSICIAN: Mila Merry, MD   REQUESTING/REFERRING PHYSICIAN: Dr. Jene Every  CHIEF COMPLAINT:   Chief Complaint  Patient presents with  . Fall   altered mental status  HISTORY OF PRESENT ILLNESS:  Marilyn Brown  is a 44 y.o. female with a known history of with a known history of depression, hypertension, anxiety, chronic anemia who [facility] after sustaining a mechanical fall and noted to be confused. Patient underwent a CT scan of the head in the ER which showed possible low density area in the occipital lobe consistent with a hemorrhagic contusion versus artifact. The attempted to get an MRI but given the patient's confusion that was not possible. Patient continues to have waxing and waning confusion and therefore hospitalist services were contacted further treatment and evaluation. Patient is a very poor historian and her mental status waxes and wanes.  Patient although presently denies any nausea, vomiting, headache, blurry vision, chest pain, shortness of breath, numbness or tingling or any other associated symptoms presently.  PAST MEDICAL HISTORY:   Past Medical History  Diagnosis Date  . Anemia   . Anxiety   . Right leg weakness   . Hypertension   . Depression     PAST SURGICAL HISTORY:  History reviewed. No pertinent past surgical history.  SOCIAL HISTORY:   Social History  Substance Use Topics  . Smoking status: Current Every Day Smoker -- 0.50 packs/day for 22 years  . Smokeless tobacco: Not on file     Comment: smokes <1 pack of cigarettes per day, has been smoking for 22 years  . Alcohol Use: 0.0 oz/week    0 Standard drinks or equivalent per week     Comment: moderate alcohol use    FAMILY HISTORY:   Family History  Problem  Relation Age of Onset  . Bipolar disorder Mother   . Hypertension Father     DRUG ALLERGIES:   Allergies  Allergen Reactions  . Triamcinolone Swelling  . Prednisone Anxiety    Patient had swelling     REVIEW OF SYSTEMS:   Review of Systems  Unable to perform ROS: mental acuity    MEDICATIONS AT HOME:   Prior to Admission medications   Medication Sig Start Date End Date Taking? Authorizing Provider  alprazolam Prudy Feeler) 2 MG tablet Take 1 tablet (2 mg total) by mouth every 8 (eight) hours as needed. Patient taking differently: Take 2 mg by mouth every 8 (eight) hours as needed for anxiety.  06/25/15  Yes Malva Limes, MD  amLODipine (NORVASC) 5 MG tablet Take 1 tablet (5 mg total) by mouth daily. 07/28/15  Yes Malva Limes, MD  atenolol (TENORMIN) 50 MG tablet Take 1 tablet (50 mg total) by mouth 2 (two) times daily. 04/12/15  Yes Malva Limes, MD  baclofen (LIORESAL) 10 MG tablet Take 10 mg by mouth every 6 (six) hours as needed for muscle spasms.    Yes Historical Provider, MD  butalbital-acetaminophen-caffeine (FIORICET, ESGIC) 50-325-40 MG tablet Take 1 tablet by mouth daily as needed for headache. 07/21/15  Yes Malva Limes, MD  gabapentin (NEURONTIN) 600 MG tablet Take 900-1,200 mg by mouth See admin instructions. 900 mg every morning, 900 mg every afternoon, and 1200 mg at bedtime   Yes Historical  Provider, MD  oxybutynin (DITROPAN XL) 15 MG 24 hr tablet Take 15 mg by mouth at bedtime.   Yes Historical Provider, MD  oxyCODONE (OXY IR/ROXICODONE) 5 MG immediate release tablet Take 5 mg by mouth every 6 (six) hours as needed for moderate pain or severe pain.   Yes Historical Provider, MD  topiramate (TOPAMAX) 25 MG tablet Take 1 tablet (25 mg total) by mouth 2 (two) times daily. 05/28/15  Yes Malva Limesonald E Fisher, MD  sertraline (ZOLOFT) 50 MG tablet Take 0.5-2 tablets (25-100 mg total) by mouth daily. Patient not taking: Reported on 08/11/2015 08/04/15   Malva Limesonald E Fisher, MD       VITAL SIGNS:  Blood pressure 119/69, pulse 66, temperature 97.9 F (36.6 C), temperature source Oral, resp. rate 22, height 5\' 4"  (1.626 m), weight 58.968 kg (130 lb), SpO2 98 %.  PHYSICAL EXAMINATION:  Physical Exam  GENERAL:  44 y.o.-year-old patient lying in the bed with no acute distress.  EYES: Pupils equal, round, reactive to light and accommodation. No scleral icterus. Extraocular muscles intact.  HEENT: Head atraumatic, normocephalic. Oropharynx and nasopharynx clear. No oropharyngeal erythema, moist oral mucosa  NECK:  Supple, no jugular venous distention. No thyroid enlargement, no tenderness.  LUNGS: Normal breath sounds bilaterally, no wheezing, rales, rhonchi. No use of accessory muscles of respiration.  CARDIOVASCULAR: S1, S2 RRR. No murmurs, rubs, gallops, clicks.  ABDOMEN: Soft, nontender, nondistended. Bowel sounds present. No organomegaly or mass.  EXTREMITIES: No pedal edema, cyanosis, or clubbing. + 2 pedal & radial pulses b/l.   NEUROLOGIC: Cranial nerves II through XII are intact. No focal Motor or sensory deficits appreciated b/l PSYCHIATRIC: The patient is alert and oriented x 1. Poor affect.  SKIN: No obvious rash, lesion, or ulcer.   LABORATORY PANEL:   CBC  Recent Labs Lab 08/11/15 1307  WBC 16.6*  HGB 16.3*  HCT 48.7*  PLT 143*   ------------------------------------------------------------------------------------------------------------------  Chemistries   Recent Labs Lab 08/11/15 1307  NA 138  K 3.2*  CL 104  CO2 24  GLUCOSE 89  BUN 33*  CREATININE 0.72  CALCIUM 9.5  AST 26  ALT 31  ALKPHOS 61  BILITOT 2.1*   ------------------------------------------------------------------------------------------------------------------  Cardiac Enzymes  Recent Labs Lab 08/11/15 1307  TROPONINI <0.03   ------------------------------------------------------------------------------------------------------------------  RADIOLOGY:  Ct  Head Wo Contrast  08/11/2015  CLINICAL DATA:  Fall today, hitting head on floor. Laceration at the back of the head. No reported loss of consciousness. Initial encounter. EXAM: CT HEAD WITHOUT CONTRAST TECHNIQUE: Contiguous axial images were obtained from the base of the skull through the vertex without intravenous contrast. COMPARISON:  09/15/2014 FINDINGS: There is no evidence of acute intracranial hemorrhage, mass, midline shift, or extra-axial fluid collection. There is a small region of decreased attenuation extending to the cortex in the left occipital lobe (series 2, images 11 and 12 and series 4, images 20 and 21 of the reformatted images), however this may be related to streak artifact and sulcal volume averaging. There is no evidence of edema elsewhere. Ventricles and sulci are normal. Orbits are unremarkable. No skull fracture is seen. Paranasal sinuses and mastoid air cells are clear. IMPRESSION: No hemorrhage or definite acute intracranial abnormality identified. Small region of possible low density in the left occipital lobe is favored to be artifactual although it is difficult to completely exclude mild edema due to recent ischemia or nonhemorrhagic contusion. Electronically Signed   By: Sebastian AcheAllen  Grady M.D.   On: 08/11/2015 12:14  IMPRESSION AND PLAN:   44 year old female with past His81tory of depression, chronic anemia, hypertension, migraines, presents to the hospital after a fall [facility] and presented to the hospital with altered mental status.    #1 altered mental status-the exact etiology is unclear. This is likely acute delirium but the etiology is unclear. Patient CT head does show a possible nonhemorrhagic contusion versus artifact but I think this is likely incidental and artifactual in nature. -Patient has no evidence of acute neurologic process given her nonfocal neurological exam. There is no evidence of acute infectious source. -Patient has been seen by psychiatry in the  in the ER and they think this possibly could be delirium from acute withdrawal/substance abuse. Urine drug screen is although negative. The recommended medical admission and observation overnight and they will continue to follow.  #2 hypertension-continue atenolol, amlodipine.  #3 history of migraines-continue Fioricet, Topamax.  #4 history and incontinence-continue oxybutynin.  #5 neuropathy-continue Neurontin.   All the records are reviewed and case discussed with ED provider. Management plans discussed with the patient, family and they are in agreement.  CODE STATUS: Full  TOTAL TIME TAKING CARE OF THIS PATIENT: 50 minutes.    Houston Siren M.D on 08/11/2015 at 4:45 PM  Between 7am to 6pm - Pager - [phone]  After 6pm go to [email or web address] - password [email or web address] [facility]  [facility]  Office  [phone]  CC: Primary care physician; Mila Merry, MD

## 2015-08-11 NOTE — Consult Note (Signed)
[facility] Consult   Reason for Consult:  This is a consult in the emergency room for this 44 year old woman who presented from the [facility] after a fall. On observation she was found to be delirious. Consultation regarding mental state. Referring Physician:  Corky Downs Patient Identification: Marilyn Brown MRN:  [id number] Principal Diagnosis: Delirium Diagnosis:   Patient Active Problem List   Diagnosis Date Noted  . Delirium [R41.0] 08/11/2015  . Depression [F32.9] 08/04/2015  . Accumulation of fluid in tissues [R60.9] 05/04/2015  . Bulge of lumbar disc without myelopathy [M51.26] 05/04/2015  . Contusion [T14.8] 05/04/2015  . Neuropathy ([place]) [G62.9] 05/04/2015  . Neuralgia neuritis, sciatic nerve [M54.30] 05/04/2015  . Anxiety [F41.9] 04/11/2015  . BP (high blood pressure) [I10] 04/11/2015  . Lumbar radiculopathy [M54.16] 09/26/2014  . Deliberate medication overdose ([place]) [T50.902A] 09/15/2014  . Polypharmacy [Z79.899] 05/27/2014  . Displacement of lumbar intervertebral disc without myelopathy [M51.26] 11/26/2013  . Deflected nasal septum [J34.2] 06/29/2013  . Cephalalgia [R51] 06/29/2013  . Allergic rhinitis [J30.9] 06/29/2013  . Nasal obstruction [J34.89] 06/29/2013  . 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 FOM (frequency of micturition) [R35.0] 04/10/2012  . Cervical pain [M54.2] 01/17/2012  . Thoracic and lumbosacral neuritis [M54.14, M54.17] 12/27/2011  . Abnormal toxicological findings [R89.2] 04/25/2011  . Excess, menstruation [N92.0] 02/08/2011  . Disorder of sacrum [M53.3] 12/05/2010  . Menopausal symptom [N95.1] 05/01/2010  . 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 Hemorrhage of gastrointestinal tract [K92.2] 03/03/2009  . Absolute anemia [D64.9] 03/01/2009  . Fatty metamorphosis of liver [K76.0] 02/27/2009  . Disorder of teeth and supporting structures [K08.9] 08/25/2007  . Essential  (primary) hypertension [I10] 10/20/2006  . Insomnia [G47.00] 09/30/2002  . Anxiety disorder [F41.9] 09/30/1998  . Cervical dysplasia [N87.9] 09/30/1998    Total Time spent with patient: 1 hour  Subjective:   Marilyn Brown is a 44 y.o. female patient admitted with "I'm looking for my husband".  HPI:  Information from the patient and the chart. Patient interviewed. Got a little bit of information from the [employer] officers. Reviewed case with the emergency room physicians. Reviewed chart including current labs. Looked up her previous controlled substance prescriptions. On interview the patient was confused with waxing and waning mental status. Unclear whether I could take anything she said at face value. When we first started talking she insisted that she had never been to jail and that she was not currently in an emergency room. She told me that I was the grandmother of somebody and that she only wanted to speak to her husband. After a few minutes of talking she was able to repeat to me that she was at [facility] but she still was waxing and waning in her mental state. Patient complains of some pain in her right leg which she says is chronic. Otherwise she doesn't have specific physical complaints. She does not report any suicidal ideation. She is not aware of having hallucinations. She is frequently talking to herself when no one else is engaging with her and makes very little sense. Does not seem to have made any effort to harm herself. The CT scan of her head is thought to be negative. The only abnormality is judged to be likely an artifact. On her lab test she has an elevated white count and an elevated bilirubin of unclear etiology. Patient tells me that she does take Xanax regularly and although she is not reliable about the dates tells  me that she was taking it until probably a month ago. She also says that she was taking muscle relaxers and other pain medicines.  Social  history: Patient reports that she lives with her husband. I have tried the phone numbers that we have in the chart and have been unable to reach anybody who knows her. Not working. She is currently in jail although I do not know why.  Medical history: History of chronic pain history of sacral nerve compression by her report history of degenerative disc disease and high blood pressure. No known history of stroke.  Substance abuse history: Patient tells me that she had been drinking alcohol. Again I don't have any idea how reliable any of her history can be. She denies that she had been abusing her medicines. Patient was seen here in the hospital a couple years ago and was in the critical care unit because of an overdose. There was strong suspicion at the time that she was abusing opiates or benzodiazepines and clear evidence that she had overdosed. Past Psychiatric History: Patient has been seen here in the past and in fact I evaluated her a couple years ago after an overdose on multiple medications with a probable history of abuse of prescription medicines unknown if she is gone for any kind of follow-up mental health treatment since then  Risk to Self: Is patient at risk for suicide?: No Risk to Others:   Prior Inpatient Therapy:   Prior Outpatient Therapy:    Past Medical History:  Past Medical History  Diagnosis Date  . Anemia   . Anxiety   . Right leg weakness   . Hypertension   . Depression    History reviewed. No pertinent past surgical history. Family History:  Family History  Problem Relation Age of Onset  . Bipolar disorder Mother   . Hypertension Father    Family Psychiatric  History: Patient denies any family history of mental health or substance abuse problems although I once again repeat I don't know how reliable decisions. Social History:  History  Alcohol Use  . 0.0 oz/week  . 0 Standard drinks or equivalent per week    Comment: moderate alcohol use     History   Drug Use No    Social History   Social History  . Marital Status: Married    Spouse Name: N/A  . Number of Children: 2  . Years of Education: N/A   Occupational History  . Disabled     not   Social History Main Topics  . Smoking status: Current Every Day Smoker -- 0.50 packs/day for 22 years  . Smokeless tobacco: None     Comment: smokes <1 pack of cigarettes per day, has been smoking for 22 years  . Alcohol Use: 0.0 oz/week    0 Standard drinks or equivalent per week     Comment: moderate alcohol use  . Drug Use: No  . Sexual Activity: Not Asked   Other Topics Concern  . None   Social History Narrative   Additional Social History:                          Allergies:   Allergies  Allergen Reactions  . Triamcinolone Swelling  . Prednisone Anxiety    Patient had swelling     Labs:  Results for orders placed or performed during the hospital encounter of 08/11/15 (from the past 48 hour(s))  Urinalysis complete, with microscopic (  [place] only)     Status: Abnormal   Collection Time: 08/11/15  1:06 PM  Result Value Ref Range   Color, Urine AMBER (A) YELLOW   APPearance CLEAR (A) CLEAR   Glucose, UA NEGATIVE NEGATIVE mg/dL   Bilirubin Urine NEGATIVE NEGATIVE   Ketones, ur 1+ (A) NEGATIVE mg/dL   Specific Gravity, Urine 1.025 1.005 - 1.030   Hgb urine dipstick 2+ (A) NEGATIVE   pH 6.0 5.0 - 8.0   Protein, ur 100 (A) NEGATIVE mg/dL   Nitrite NEGATIVE NEGATIVE   Leukocytes, UA NEGATIVE NEGATIVE   RBC / HPF 0-5 0 - 5 RBC/hpf   WBC, UA 0-5 0 - 5 WBC/hpf   Bacteria, UA NONE SEEN NONE SEEN   Squamous Epithelial / LPF 0-5 (A) NONE SEEN   Mucous PRESENT   Urine Drug Screen, Qualitative ([facility] only)     Status: None   Collection Time: 08/11/15  1:06 PM  Result Value Ref Range   Tricyclic, Ur Screen NONE DETECTED NONE DETECTED   Amphetamines, Ur Screen NONE DETECTED NONE DETECTED   MDMA (Ecstasy)Ur Screen NONE DETECTED NONE DETECTED   Cocaine Metabolite,Ur [place]  NONE DETECTED NONE DETECTED   Opiate, Ur Screen NONE DETECTED NONE DETECTED   Phencyclidine (PCP) Ur S NONE DETECTED NONE DETECTED   Cannabinoid 50 Ng, Ur [place] NONE DETECTED NONE DETECTED   Barbiturates, Ur Screen NONE DETECTED NONE DETECTED   Benzodiazepine, Ur Scrn NONE DETECTED NONE DETECTED   Methadone Scn, Ur NONE DETECTED NONE DETECTED    Comment: (NOTE) 726  Tricyclics, urine               Cutoff 1000 ng/mL 200  Amphetamines, urine             Cutoff 1000 ng/mL 300  MDMA (Ecstasy), urine           Cutoff 500 ng/mL 400  Cocaine Metabolite, urine       Cutoff 300 ng/mL 500  Opiate, urine                   Cutoff 300 ng/mL 600  Phencyclidine (PCP), urine      Cutoff 25 ng/mL 700  Cannabinoid, urine              Cutoff 50 ng/mL 800  Barbiturates, urine             Cutoff 200 ng/mL 900  Benzodiazepine, urine           Cutoff 200 ng/mL 1000 Methadone, urine                Cutoff 300 ng/mL 1100 1200 The urine drug screen provides only a preliminary, unconfirmed 1300 analytical test result and should not be used for non-medical 1400 purposes. Clinical consideration and professional judgment should 1500 be applied to any positive drug screen result due to possible 1600 interfering substances. A more specific alternate chemical method 1700 must be used in order to obtain a confirmed analytical result.  1800 Gas chromato graphy / mass spectrometry (GC/MS) is the preferred 1900 confirmatory method.   CBC     Status: Abnormal   Collection Time: 08/11/15  1:07 PM  Result Value Ref Range   WBC 16.6 (H) 3.6 - 11.0 K/uL   RBC 4.89 3.80 - 5.20 MIL/uL   Hemoglobin 16.3 (H) 12.0 - 16.0 g/dL   HCT 48.7 (H) 35.0 - 47.0 %   MCV 99.5 80.0 - 100.0 fL   MCH 33.2 26.0 -  34.0 pg   MCHC 33.4 32.0 - 36.0 g/dL   RDW 12.7 11.5 - 14.5 %   Platelets 143 (L) 150 - 440 K/uL  Comprehensive metabolic panel     Status: Abnormal   Collection Time: 08/11/15  1:07 PM  Result Value Ref Range   Sodium 138 135 -  145 mmol/L   Potassium 3.2 (L) 3.5 - 5.1 mmol/L   Chloride 104 101 - 111 mmol/L   CO2 24 22 - 32 mmol/L   Glucose, Bld 89 65 - 99 mg/dL   BUN 33 (H) 6 - 20 mg/dL   Creatinine, Ser 0.72 0.44 - 1.00 mg/dL   Calcium 9.5 8.9 - 10.3 mg/dL   Total Protein 8.0 6.5 - 8.1 g/dL   Albumin 4.7 3.5 - 5.0 g/dL   AST 26 15 - 41 U/L   ALT 31 14 - 54 U/L   Alkaline Phosphatase 61 38 - 126 U/L   Total Bilirubin 2.1 (H) 0.3 - 1.2 mg/dL   GFR calc non Af Amer >60 >60 mL/min   GFR calc Af Amer >60 >60 mL/min    Comment: (NOTE) The eGFR has been calculated using the CKD EPI equation. This calculation has not been validated in all clinical situations. eGFR's persistently <60 mL/min signify possible Chronic Kidney Disease.    Anion gap 10 5 - 15  Troponin I     Status: None   Collection Time: 08/11/15  1:07 PM  Result Value Ref Range   Troponin I <0.03 <0.031 ng/mL    Comment:        NO INDICATION OF MYOCARDIAL INJURY.     Current Facility-Administered Medications  Medication Dose Route Frequency Provider Last Rate Last Dose  . carbamazepine (TEGRETOL) tablet 200 mg  200 mg Oral BID AC Chennel Olivos T Kortny Lirette, MD      . diazepam (VALIUM) tablet 5 mg  5 mg Oral TID Gonzella Lex, MD       Current Outpatient Prescriptions  Medication Sig Dispense Refill  . alprazolam (XANAX) 2 MG tablet Take 1 tablet (2 mg total) by mouth every 8 (eight) hours as needed. (Patient taking differently: Take 2 mg by mouth every 8 (eight) hours as needed for anxiety. ) 90 tablet 1  . amLODipine (NORVASC) 5 MG tablet Take 1 tablet (5 mg total) by mouth daily. 30 tablet 1  . atenolol (TENORMIN) 50 MG tablet Take 1 tablet (50 mg total) by mouth 2 (two) times daily. 180 tablet 1  . baclofen (LIORESAL) 10 MG tablet Take 10 mg by mouth every 6 (six) hours as needed for muscle spasms.     . butalbital-acetaminophen-caffeine (FIORICET, ESGIC) 50-325-40 MG tablet Take 1 tablet by mouth daily as needed for headache. 30 tablet 1  .  gabapentin (NEURONTIN) 600 MG tablet Take 900-1,200 mg by mouth See admin instructions. 900 mg every morning, 900 mg every afternoon, and 1200 mg at bedtime    . oxybutynin (DITROPAN XL) 15 MG 24 hr tablet Take 15 mg by mouth at bedtime.    Marland Kitchen oxyCODONE (OXY IR/ROXICODONE) 5 MG immediate release tablet Take 5 mg by mouth every 6 (six) hours as needed for moderate pain or severe pain.    Marland Kitchen topiramate (TOPAMAX) 25 MG tablet Take 1 tablet (25 mg total) by mouth 2 (two) times daily. 180 tablet 1  . sertraline (ZOLOFT) 50 MG tablet Take 0.5-2 tablets (25-100 mg total) by mouth daily. (Patient not taking: Reported on 08/11/2015) 30 tablet 3  Musculoskeletal: Strength & Muscle Tone: decreased Gait & Station: unable to stand Patient leans: N/A  Psychiatric Specialty Exam: Review of Systems  Unable to perform ROS: mental acuity    Blood pressure 119/69, pulse 66, temperature 97.9 F (36.6 C), temperature source Oral, resp. rate 22, height 5' 4"  (1.626 m), weight 58.968 kg (130 lb), SpO2 98 %.Body mass index is 22.3 kg/(m^2).  General Appearance: Disheveled  Eye Contact::  Poor  Speech:  Garbled  Volume:  Decreased  Mood:  Anxious  Affect:  Labile  Thought Process:  Disorganized  Orientation:  Negative  Thought Content:  Delusions, Ideas of Reference:   Delusions and Rumination  Suicidal Thoughts:  No  Homicidal Thoughts:  No  Memory:  Negative  Judgement:  Poor  Insight:  Shallow  Psychomotor Activity:  Decreased  Concentration:  Poor  Recall:  Poor  Fund of Knowledge:Poor  Language: Poor  Akathisia:  No  Handed:  Right  AIMS (if indicated):     Assets:  Desire for Improvement Physical Health Resilience  ADL's:  Impaired  Cognition: Impaired,  Severe  Sleep:      Treatment Plan Summary: Daily contact with patient to assess and evaluate symptoms and progress in treatment, Medication management and Plan Case reviewed. Discussed case with emergency room attending and hospitalist  on duty. Clinically this patient is presenting as being delirious. She has a past history of abuse of medication. Currently her drug screen is negative. I'm told that she has been in the jail for about 7 days and presumably has not been abusing drugs during that time. Differential diagnosis would include withdrawal delirium from benzodiazepines or alcohol, possible malingering, possible delirium from other causes not yet determined. I don't have a definite way to rule in or out any of these but I agree with the emergency room physician that sending her back to jail in her current state is likely to prove a bad strategy. I support the idea of admitting her to the hospital on the medical service for temporary observation and reevaluation. In the meantime I have put in orders for diazepam 5 mg 3 times a day and Tegretol 200 mg twice a day to help with Xanax withdrawal. I will continue to follow-up and see her over the weekend. No other change to treatment plan. No sign of depression currently. No known reason to suspect suicidality. No history that would suggest a psychotic disorder.  Disposition: Patient does not meet criteria for psychiatric inpatient admission. Supportive therapy provided about ongoing stressors.  Ingri Diemer 08/11/2015 4:43 PM

## 2015-08-11 NOTE — ED Provider Notes (Signed)
[facility] Emergency Department Provider Note  ____________________________________________  Time seen: On arrival  I have reviewed the triage vital signs and the nursing notes.   HISTORY  Chief Complaint Fall    HPI Marilyn Brown is a 44 y.o. female brought in by officers from [facility]. She had a mechanical fall today and struck the back of her head on the floor. She sustained a laceration. She denies LOC. She denies other injuries. She is moving all extremities. She denies being on blood thinners.He denies dizziness or chest pain or shortness of breath     Past Medical History  Diagnosis Date  . Anemia   . Anxiety   . Right leg weakness     Patient Active Problem List   Diagnosis Date Noted  . Depression 08/04/2015  . Accumulation of fluid in tissues 05/04/2015  . Bulge of lumbar disc without myelopathy 05/04/2015  . Contusion 05/04/2015  . Neuropathy (HCC) 05/04/2015  . Neuralgia neuritis, sciatic nerve 05/04/2015  . Anxiety 04/11/2015  . BP (high blood pressure) 04/11/2015  . Lumbar radiculopathy 09/26/2014  . Deliberate medication overdose (HCC) 09/15/2014  . Polypharmacy 05/27/2014  . Displacement of lumbar intervertebral disc without myelopathy 11/26/2013  . Deflected nasal septum 06/29/2013  . Cephalalgia 06/29/2013  . Allergic rhinitis 06/29/2013  . Nasal obstruction 06/29/2013  . DDD (degenerative disc disease), lumbosacral 02/23/2013  . FOM (frequency of micturition) 04/10/2012  . Cervical pain 01/17/2012  . Thoracic and lumbosacral neuritis 12/27/2011  . Abnormal toxicological findings 04/25/2011  . Excess, menstruation 02/08/2011  . Disorder of sacrum 12/05/2010  . Menopausal symptom 05/01/2010  . Compulsive tobacco user syndrome 04/27/2010  . Hemorrhage of gastrointestinal tract 03/03/2009  . Absolute anemia 03/01/2009  . Fatty metamorphosis of liver 02/27/2009  . Disorder of teeth and supporting  structures 08/25/2007  . Essential (primary) hypertension 10/20/2006  . Insomnia 09/30/2002  . Anxiety disorder 09/30/1998  . Cervical dysplasia 09/30/1998    History reviewed. No pertinent past surgical history.  Current Outpatient Rx  Name  Route  Sig  Dispense  Refill  . alprazolam (XANAX) 2 MG tablet   Oral   Take 1 tablet (2 mg total) by mouth every 8 (eight) hours as needed.   90 tablet   1   . amLODipine (NORVASC) 5 MG tablet   Oral   Take 1 tablet (5 mg total) by mouth daily. Patient taking differently: Take 5 mg by mouth 2 (two) times daily.    30 tablet   1   . atenolol (TENORMIN) 50 MG tablet   Oral   Take 1 tablet (50 mg total) by mouth 2 (two) times daily. Patient taking differently: Take 50 mg by mouth 2 (two) times daily. 1/2 tablet prn   180 tablet   1   . baclofen (LIORESAL) 10 MG tablet   Oral   Take 10 mg by mouth every 6 (six) hours as needed. As needed for muscle spasms         . butalbital-acetaminophen-caffeine (FIORICET, ESGIC) 50-325-40 MG tablet   Oral   Take 1 tablet by mouth daily as needed for headache.   30 tablet   1   . cetirizine (ZYRTEC) 10 MG tablet   Oral   Take 10 mg by mouth daily.         Marland Kitchen gabapentin (NEURONTIN) 600 MG tablet   Oral   Take 600 mg by mouth. Taking 900 mg in the morning, 900 mg in  the evening, and 1200 mg at bedtime.         Marland Kitchen. levonorgestrel (MIRENA, 52 MG,) 20 MCG/24HR IUD   Intrauterine   by Intrauterine route.         Marland Kitchen. oxybutynin (DITROPAN XL) 15 MG 24 hr tablet   Oral   Take 15 mg by mouth at bedtime.         Marland Kitchen. oxyCODONE (OXY IR/ROXICODONE) 5 MG immediate release tablet   Oral   Take 5 mg by mouth every 6 (six) hours as needed.         . potassium gluconate 595 MG TABS tablet   Oral   Take 1 tablet by mouth daily.         . sertraline (ZOLOFT) 50 MG tablet   Oral   Take 0.5-2 tablets (25-100 mg total) by mouth daily.   30 tablet   3   . topiramate (TOPAMAX) 25 MG tablet    Oral   Take 1 tablet (25 mg total) by mouth 2 (two) times daily.   180 tablet   1     Allergies Triamcinolone and Prednisone  Family History  Problem Relation Age of Onset  . Bipolar disorder Mother   . Hypertension Father     Social History Social History  Substance Use Topics  . Smoking status: Current Every Day Smoker -- 0.50 packs/day for 22 years  . Smokeless tobacco: None     Comment: smokes <1 pack of cigarettes per day, has been smoking for 22 years  . Alcohol Use: 0.0 oz/week    0 Standard drinks or equivalent per week     Comment: moderate alcohol use    Review of Systems  Constitutional: Negative for fever. Eyes: Negative for visual changes. ENT: Negative for sore throat Cardiovascular: Negative for chest pain. Respiratory: Negative for shortness of breath. Gastrointestinal: Negative for abdominal pain, vomiting and diarrhea. Genitourinary: Negative for dysuria. Musculoskeletal: Negative for back pain. No neck pain Skin: Negative for rash. Positive for laceration Neurological: Negative for headaches or focal weakness Psychiatric: No anxiety    ____________________________________________   PHYSICAL EXAM:  VITAL SIGNS: ED Triage Vitals  Enc Vitals Group     BP 08/11/15 1034 138/53 mmHg     Pulse Rate 08/11/15 1034 65     Resp 08/11/15 1034 16     Temp 08/11/15 1034 97.9 F (36.6 C)     Temp Source 08/11/15 1034 Oral     SpO2 08/11/15 1034 100 %     Weight 08/11/15 1033 130 lb (58.968 kg)     Height 08/11/15 1033 5\' 4"  (1.626 m)     Head Cir --      Peak Flow --      Pain Score 08/11/15 1033 5     Pain Loc --      Pain Edu? --      Excl. in GC? --      Constitutional: Alert, answers questions with prompting Eyes: Conjunctivae are normal. PERRL, CN II through XII normal ENT   Head: Normocephalic. 3 cm linear laceration to the posterior scalp, galea intact, no bleeding   Mouth/Throat: Mucous membranes are moist. Cardiovascular:  Normal rate, regular rhythm. Normal and symmetric distal pulses are present in all extremities. No murmurs, rubs, or gallops. Respiratory: Normal respiratory effort without tachypnea nor retractions. Breath sounds are clear and equal bilaterally.  Gastrointestinal: Soft and non-tender in all quadrants. No distention. There is no CVA tenderness. Genitourinary: deferred Musculoskeletal: Nontender  with normal range of motion in all extremities. No lower extremity tenderness nor edema. Neurologic:  Normal speech and language. No gross focal neurologic deficits are appreciated. Moves all extremity is equally Skin:  Skin is warm, dry and intact. No rash noted. Psychiatric: Mood and affect are normal. Patient exhibits appropriate insight and judgment.  ____________________________________________    LABS (pertinent positives/negatives)  Labs Reviewed - No data to display  ____________________________________________   EKG  None  ____________________________________________    RADIOLOGY I have personally reviewed any xrays that were ordered on this patient: CT head shows no acute abnormalities  ____________________________________________   PROCEDURES  Procedure(s) performed: yes  LACERATION REPAIR Performed by: Jene Every Authorized by: Jene Every Consent: Verbal consent obtained. Risks and benefits: risks, benefits and alternatives were discussed Consent given by: patient Patient identity confirmed: provided demographic data Prepped and Draped in normal sterile fashion Wound explored  Laceration Location: Scalp  Laceration Length: 3 cm  No Foreign Bodies seen or palpated    Irrigation method: syringe Amount of cleaning: standard  Skin closure: staples   Number of sutures: 5  Technique: stalpled  Patient tolerance: Patient tolerated the procedure well with no immediate complications.   Critical Care performed:  none  ____________________________________________   INITIAL IMPRESSION / ASSESSMENT AND PLAN / ED COURSE  Pertinent labs & imaging results that were available during my care of the patient were reviewed by me and considered in my medical decision making (see chart for details).  Patient presents after a reported mechanical fall with recent ear head laceration that was repaired in the emergency department. Head CT showed no acute abnormalities although mention was made of a small region of low density in the left occipital lobe.  She has exhibited more bizarre behavior the longer she has been here which makes me concerned that area of density could be ischemic stroke.  Will obtain an MRI of the brain. There is some resistance but forth by the officer but I insisted that family is to be called for screening MRI questions in order for [place] to believe that this test.  I removed the recently placed staples prior to MRI  ----------------------------------------- 2:47 PM on 08/11/2015 -----------------------------------------  MRI tech is unable to obtain screening answers from patient and unable to reach family. Discussed with Dr. Mariam Dollar of radiology he reports we cannot do the MR without screening questions answered.   ----------------------------------------- 3:24 PM on 08/11/2015 -----------------------------------------  Head laceration repaired with staples again, discussed with internal medicine and they will admit the patient for delirium    ____________________________________________   FINAL CLINICAL IMPRESSION(S) / ED DIAGNOSES  Final diagnoses:  Delirium  Laceration of head, initial encounter     Jene Every, MD 08/11/15 1524

## 2015-08-11 NOTE — ED Notes (Addendum)
[facility], accompanied by [facility] officers; reports pt she fell today, hit her head on floor. Pt with laceration to back of head, pt denies LOC. Pt with hx of psych issues, making random comments in triage. Officers report this is pt's normal. Pt is in forensic restraints upon arrival.

## 2015-08-12 DIAGNOSIS — R410 Disorientation, unspecified: Secondary | ICD-10-CM | POA: Diagnosis not present

## 2015-08-12 LAB — MAGNESIUM: Magnesium: 2.1 mg/dL (ref 1.7–2.4)

## 2015-08-12 LAB — CBC
HEMATOCRIT: 46.6 % (ref 35.0–47.0)
HEMOGLOBIN: 16.2 g/dL — AB (ref 12.0–16.0)
MCH: 34.5 pg — ABNORMAL HIGH (ref 26.0–34.0)
MCHC: 34.8 g/dL (ref 32.0–36.0)
MCV: 99.2 fL (ref 80.0–100.0)
Platelets: 140 10*3/uL — ABNORMAL LOW (ref 150–440)
RBC: 4.7 MIL/uL (ref 3.80–5.20)
RDW: 13.2 % (ref 11.5–14.5)
WBC: 12.8 10*3/uL — AB (ref 3.6–11.0)

## 2015-08-12 LAB — BASIC METABOLIC PANEL
ANION GAP: 13 (ref 5–15)
BUN: 28 mg/dL — AB (ref 6–20)
CALCIUM: 9.2 mg/dL (ref 8.9–10.3)
CO2: 23 mmol/L (ref 22–32)
Chloride: 103 mmol/L (ref 101–111)
Creatinine, Ser: 0.7 mg/dL (ref 0.44–1.00)
GFR calc Af Amer: >60 mL/min (ref >60)
GLUCOSE: 98 mg/dL (ref 65–99)
POTASSIUM: 3 mmol/L — AB (ref 3.5–5.1)
SODIUM: 139 mmol/L (ref 135–145)

## 2015-08-12 LAB — MRSA PCR SCREENING: MRSA by PCR: NEGATIVE

## 2015-08-12 MED ORDER — POTASSIUM CHLORIDE CRYS ER 20 MEQ PO TBCR
40.0000 meq | EXTENDED_RELEASE_TABLET | Freq: Once | ORAL | Status: AC
  Administered 2015-08-12: 40 meq via ORAL
  Filled 2015-08-12: qty 2

## 2015-08-12 MED ORDER — POTASSIUM CHLORIDE IN NACL 20-0.9 MEQ/L-% IV SOLN
INTRAVENOUS | Status: DC
  Administered 2015-08-12 – 2015-08-14 (×3): via INTRAVENOUS
  Filled 2015-08-12 (×4): qty 1000

## 2015-08-12 NOTE — Consult Note (Signed)
[facility] Consult   Reason for Consult:  This is a consult in the emergency room for this 44 year old woman who presented from the [facility] after a fall. On observation she was found to be delirious. Consultation regarding mental state. Referring Physician:  Corky Downs Patient Identification: Marilyn Brown MRN:  [id number] Principal Diagnosis: Delirium Diagnosis:   Patient Active Problem List   Diagnosis Date Noted  . Delirium [R41.0] 08/11/2015  . Altered mental status [R41.82] 08/11/2015  . Depression [F32.9] 08/04/2015  . Accumulation of fluid in tissues [R60.9] 05/04/2015  . Bulge of lumbar disc without myelopathy [M51.26] 05/04/2015  . Contusion [T14.8] 05/04/2015  . Neuropathy ([place]) [G62.9] 05/04/2015  . Neuralgia neuritis, sciatic nerve [M54.30] 05/04/2015  . Anxiety [F41.9] 04/11/2015  . BP (high blood pressure) [I10] 04/11/2015  . Lumbar radiculopathy [M54.16] 09/26/2014  . Deliberate medication overdose ([place]) [T50.902A] 09/15/2014  . Polypharmacy [Z79.899] 05/27/2014  . Displacement of lumbar intervertebral disc without myelopathy [M51.26] 11/26/2013  . Deflected nasal septum [J34.2] 06/29/2013  . Cephalalgia [R51] 06/29/2013  . Allergic rhinitis [J30.9] 06/29/2013  . Nasal obstruction [J34.89] 06/29/2013  . 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 FOM (frequency of micturition) [R35.0] 04/10/2012  . Cervical pain [M54.2] 01/17/2012  . Thoracic and lumbosacral neuritis [M54.14, M54.17] 12/27/2011  . Abnormal toxicological findings [R89.2] 04/25/2011  . Excess, menstruation [N92.0] 02/08/2011  . Disorder of sacrum [M53.3] 12/05/2010  . Menopausal symptom [N95.1] 05/01/2010  . 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 Hemorrhage of gastrointestinal tract [K92.2] 03/03/2009  . Absolute anemia [D64.9] 03/01/2009  . Fatty metamorphosis of liver [K76.0] 02/27/2009  . Disorder of teeth and supporting  structures [K08.9] 08/25/2007  . Essential (primary) hypertension [I10] 10/20/2006  . Insomnia [G47.00] 09/30/2002  . Anxiety disorder [F41.9] 09/30/1998  . Cervical dysplasia [N87.9] 09/30/1998    Total Time spent with patient: 1 hour  Subjective:   Marilyn Brown is a 44 y.o. female patient admitted with "I'm looking for my husband".  HPI:  Information from the patient and the chart. Patient interviewed. Got a little bit of information from the [employer] officers. Reviewed case with the emergency room physicians. Reviewed chart including current labs. Looked up her previous controlled substance prescriptions. On interview the patient was confused with waxing and waning mental status. Unclear whether I could take anything she said at face value. When we first started talking she insisted that she had never been to jail and that she was not currently in an emergency room. She told me that I was the grandmother of somebody and that she only wanted to speak to her husband. After a few minutes of talking she was able to repeat to me that she was at [facility] but she still was waxing and waning in her mental state. Patient complains of some pain in her right leg which she says is chronic. Otherwise she doesn't have specific physical complaints. She does not report any suicidal ideation. She is not aware of having hallucinations. She is frequently talking to herself when no one else is engaging with her and makes very little sense. Does not seem to have made any effort to harm herself. The CT scan of her head is thought to be negative. The only abnormality is judged to be likely an artifact. On her lab test she has an elevated white count and an elevated bilirubin of unclear etiology. Patient tells me that she does take Xanax regularly and although she  is not reliable about the dates tells me that she was taking it until probably a month ago. She also says that she was taking muscle  relaxers and other pain medicines.  Social history: Patient reports that she lives with her husband. I have tried the phone numbers that we have in the chart and have been unable to reach anybody who knows her. Not working. She is currently in jail although I do not know why.  Medical history: History of chronic pain history of sacral nerve compression by her report history of degenerative disc disease and high blood pressure. No known history of stroke.  Substance abuse history: Patient tells me that she had been drinking alcohol. Again I don't have any idea how reliable any of her history can be. She denies that she had been abusing her medicines. Patient was seen here in the hospital a couple years ago and was in the critical care unit because of an overdose. There was strong suspicion at the time that she was abusing opiates or benzodiazepines and clear evidence that she had overdosed. Past Psychiatric History: Patient has been seen here in the past and in fact I evaluated her a couple years ago after an overdose on multiple medications with a probable history of abuse of prescription medicines unknown if she is gone for any kind of follow-up mental health treatment since then  Follow-up consult today in the hospital. Patient interviewed. Chart reviewed. Case discussed with hospitalist on-call Dr Leslye Peer. Patient is dramatically better today than when I saw her yesterday. She is sitting up in bed attentive. Makes good eye contact. Actually speaks when spoken to and has a coherent conversation. During the time we were talking she did not make any bizarre statements. She was oriented to her situation. She continues to state she can't remember being in jail or anything prior to coming into the hospital except that she does have a husband and had been living previously in the community. Patient was able to identify me without any bizarre statements like she had yesterday. Vital signs are stable. No new  physical complaints. Able to be participating with physical therapy.  Risk to Self: Is patient at risk for suicide?: No Risk to Others:   Prior Inpatient Therapy:   Prior Outpatient Therapy:    Past Medical History:  Past Medical History  Diagnosis Date  . Anemia   . Anxiety   . Right leg weakness   . Hypertension   . Depression    History reviewed. No pertinent past surgical history. Family History:  Family History  Problem Relation Age of Onset  . Bipolar disorder Mother   . Hypertension Father    Family Psychiatric  History: Patient denies any family history of mental health or substance abuse problems although I once again repeat I don't know how reliable decisions. Social History:  History  Alcohol Use  . 0.0 oz/week  . 0 Standard drinks or equivalent per week    Comment: moderate alcohol use     History  Drug Use No    Social History   Social History  . Marital Status: Married    Spouse Name: N/A  . Number of Children: 2  . Years of Education: N/A   Occupational History  . Disabled     not   Social History Main Topics  . Smoking status: Current Every Day Smoker -- 0.50 packs/day for 22 years  . Smokeless tobacco: None     Comment: smokes <1  pack of cigarettes per day, has been smoking for 22 years  . Alcohol Use: 0.0 oz/week    0 Standard drinks or equivalent per week     Comment: moderate alcohol use  . Drug Use: No  . Sexual Activity: Not Asked   Other Topics Concern  . None   Social History Narrative   Additional Social History:                          Allergies:   Allergies  Allergen Reactions  . Triamcinolone Swelling  . Prednisone Anxiety    Patient had swelling     Labs:  Results for orders placed or performed during the hospital encounter of 08/11/15 (from the past 48 hour(s))  Urinalysis complete, with microscopic ([facility] only)     Status: Abnormal   Collection Time: 08/11/15  1:06 PM  Result Value Ref Range   Color,  Urine AMBER (A) YELLOW   APPearance CLEAR (A) CLEAR   Glucose, UA NEGATIVE NEGATIVE mg/dL   Bilirubin Urine NEGATIVE NEGATIVE   Ketones, ur 1+ (A) NEGATIVE mg/dL   Specific Gravity, Urine 1.025 1.005 - 1.030   Hgb urine dipstick 2+ (A) NEGATIVE   pH 6.0 5.0 - 8.0   Protein, ur 100 (A) NEGATIVE mg/dL   Nitrite NEGATIVE NEGATIVE   Leukocytes, UA NEGATIVE NEGATIVE   RBC / HPF 0-5 0 - 5 RBC/hpf   WBC, UA 0-5 0 - 5 WBC/hpf   Bacteria, UA NONE SEEN NONE SEEN   Squamous Epithelial / LPF 0-5 (A) NONE SEEN   Mucous PRESENT   Urine Drug Screen, Qualitative ([facility] only)     Status: None   Collection Time: 08/11/15  1:06 PM  Result Value Ref Range   Tricyclic, Ur Screen NONE DETECTED NONE DETECTED   Amphetamines, Ur Screen NONE DETECTED NONE DETECTED   MDMA (Ecstasy)Ur Screen NONE DETECTED NONE DETECTED   Cocaine Metabolite,Ur [place] NONE DETECTED NONE DETECTED   Opiate, Ur Screen NONE DETECTED NONE DETECTED   Phencyclidine (PCP) Ur S NONE DETECTED NONE DETECTED   Cannabinoid 50 Ng, Ur [place] NONE DETECTED NONE DETECTED   Barbiturates, Ur Screen NONE DETECTED NONE DETECTED   Benzodiazepine, Ur Scrn NONE DETECTED NONE DETECTED   Methadone Scn, Ur NONE DETECTED NONE DETECTED    Comment: (NOTE) 182  Tricyclics, urine               Cutoff 1000 ng/mL 200  Amphetamines, urine             Cutoff 1000 ng/mL 300  MDMA (Ecstasy), urine           Cutoff 500 ng/mL 400  Cocaine Metabolite, urine       Cutoff 300 ng/mL 500  Opiate, urine                   Cutoff 300 ng/mL 600  Phencyclidine (PCP), urine      Cutoff 25 ng/mL 700  Cannabinoid, urine              Cutoff 50 ng/mL 800  Barbiturates, urine             Cutoff 200 ng/mL 900  Benzodiazepine, urine           Cutoff 200 ng/mL 1000 Methadone, urine                Cutoff 300 ng/mL 1100 1200 The urine drug screen provides only a preliminary,  unconfirmed 1300 analytical test result and should not be used for non-medical 1400 purposes. Clinical  consideration and professional judgment should 1500 be applied to any positive drug screen result due to possible 1600 interfering substances. A more specific alternate chemical method 1700 must be used in order to obtain a confirmed analytical result.  1800 Gas chromato graphy / mass spectrometry (GC/MS) is the preferred 1900 confirmatory method.   CBC     Status: Abnormal   Collection Time: 08/11/15  1:07 PM  Result Value Ref Range   WBC 16.6 (H) 3.6 - 11.0 K/uL   RBC 4.89 3.80 - 5.20 MIL/uL   Hemoglobin 16.3 (H) 12.0 - 16.0 g/dL   HCT 48.7 (H) 35.0 - 47.0 %   MCV 99.5 80.0 - 100.0 fL   MCH 33.2 26.0 - 34.0 pg   MCHC 33.4 32.0 - 36.0 g/dL   RDW 12.7 11.5 - 14.5 %   Platelets 143 (L) 150 - 440 K/uL  Comprehensive metabolic panel     Status: Abnormal   Collection Time: 08/11/15  1:07 PM  Result Value Ref Range   Sodium 138 135 - 145 mmol/L   Potassium 3.2 (L) 3.5 - 5.1 mmol/L   Chloride 104 101 - 111 mmol/L   CO2 24 22 - 32 mmol/L   Glucose, Bld 89 65 - 99 mg/dL   BUN 33 (H) 6 - 20 mg/dL   Creatinine, Ser 0.72 0.44 - 1.00 mg/dL   Calcium 9.5 8.9 - 10.3 mg/dL   Total Protein 8.0 6.5 - 8.1 g/dL   Albumin 4.7 3.5 - 5.0 g/dL   AST 26 15 - 41 U/L   ALT 31 14 - 54 U/L   Alkaline Phosphatase 61 38 - 126 U/L   Total Bilirubin 2.1 (H) 0.3 - 1.2 mg/dL   GFR calc non Af Amer >60 >60 mL/min   GFR calc Af Amer >60 >60 mL/min    Comment: (NOTE) The eGFR has been calculated using the CKD EPI equation. This calculation has not been validated in all clinical situations. eGFR's persistently <60 mL/min signify possible Chronic Kidney Disease.    Anion gap 10 5 - 15  Troponin I     Status: None   Collection Time: 08/11/15  1:07 PM  Result Value Ref Range   Troponin I <0.03 <0.031 ng/mL    Comment:        NO INDICATION OF MYOCARDIAL INJURY.   MRSA PCR Screening     Status: None   Collection Time: 08/12/15  4:00 AM  Result Value Ref Range   MRSA by PCR NEGATIVE NEGATIVE     Comment:        The GeneXpert MRSA Assay (FDA approved for NASAL specimens only), is one component of a comprehensive MRSA colonization surveillance program. It is not intended to diagnose MRSA infection nor to guide or monitor treatment for MRSA infections.   Basic metabolic panel     Status: Abnormal   Collection Time: 08/12/15  4:01 AM  Result Value Ref Range   Sodium 139 135 - 145 mmol/L   Potassium 3.0 (L) 3.5 - 5.1 mmol/L   Chloride 103 101 - 111 mmol/L   CO2 23 22 - 32 mmol/L   Glucose, Bld 98 65 - 99 mg/dL   BUN 28 (H) 6 - 20 mg/dL   Creatinine, Ser 0.70 0.44 - 1.00 mg/dL   Calcium 9.2 8.9 - 10.3 mg/dL   GFR calc non Af Amer >60 >60 mL/min  GFR calc Af Amer >60 >60 mL/min    Comment: (NOTE) The eGFR has been calculated using the CKD EPI equation. This calculation has not been validated in all clinical situations. eGFR's persistently <60 mL/min signify possible Chronic Kidney Disease.    Anion gap 13 5 - 15  CBC     Status: Abnormal   Collection Time: 08/12/15  4:01 AM  Result Value Ref Range   WBC 12.8 (H) 3.6 - 11.0 K/uL   RBC 4.70 3.80 - 5.20 MIL/uL   Hemoglobin 16.2 (H) 12.0 - 16.0 g/dL   HCT 46.6 35.0 - 47.0 %   MCV 99.2 80.0 - 100.0 fL   MCH 34.5 (H) 26.0 - 34.0 pg   MCHC 34.8 32.0 - 36.0 g/dL   RDW 13.2 11.5 - 14.5 %   Platelets 140 (L) 150 - 440 K/uL  Magnesium     Status: None   Collection Time: 08/12/15  4:01 AM  Result Value Ref Range   Magnesium 2.1 1.7 - 2.4 mg/dL    Current Facility-Administered Medications  Medication Dose Route Frequency Provider Last Rate Last Dose  . 0.9 % NaCl with KCl 20 mEq/ L  infusion   Intravenous Continuous Loletha Grayer, MD 50 mL/hr at 08/12/15 1242    . acetaminophen (TYLENOL) tablet 650 mg  650 mg Oral Q6H PRN Henreitta Leber, MD       Or  . acetaminophen (TYLENOL) suppository 650 mg  650 mg Rectal Q6H PRN Henreitta Leber, MD      . ALPRAZolam Duanne Moron) tablet 2 mg  2 mg Oral Q8H PRN Henreitta Leber, MD       . amLODipine (NORVASC) tablet 5 mg  5 mg Oral Daily Henreitta Leber, MD   5 mg at 08/12/15 0901  . atenolol (TENORMIN) tablet 50 mg  50 mg Oral BID Henreitta Leber, MD   50 mg at 08/12/15 0902  . baclofen (LIORESAL) tablet 10 mg  10 mg Oral Q6H PRN Henreitta Leber, MD      . butalbital-acetaminophen-caffeine (FIORICET, ESGIC) [phone] MG per tablet 1 tablet  1 tablet Oral Daily PRN Henreitta Leber, MD   1 tablet at 08/11/15 2146  . carbamazepine (TEGRETOL) tablet 200 mg  200 mg Oral BID AC Gonzella Lex, MD   200 mg at 08/12/15 0901  . diazepam (VALIUM) tablet 5 mg  5 mg Oral TID Gonzella Lex, MD   5 mg at 08/12/15 0902  . enoxaparin (LOVENOX) injection 40 mg  40 mg Subcutaneous Q24H Henreitta Leber, MD   40 mg at 08/11/15 1853  . gabapentin (NEURONTIN) capsule 1,200 mg  1,200 mg Oral QHS Henreitta Leber, MD   1,200 mg at 08/11/15 2156  . gabapentin (NEURONTIN) capsule 900 mg  900 mg Oral BID Henreitta Leber, MD   900 mg at 08/12/15 0901  . ondansetron (ZOFRAN) tablet 4 mg  4 mg Oral Q6H PRN Henreitta Leber, MD       Or  . ondansetron (ZOFRAN) injection 4 mg  4 mg Intravenous Q6H PRN Henreitta Leber, MD      . oxybutynin (DITROPAN-XL) 24 hr tablet 15 mg  15 mg Oral QHS Henreitta Leber, MD   15 mg at 08/11/15 2141  . oxyCODONE (Oxy IR/ROXICODONE) immediate release tablet 5 mg  5 mg Oral Q6H PRN Henreitta Leber, MD      . topiramate (TOPAMAX) tablet 25 mg  25 mg  Oral BID Henreitta Leber, MD   25 mg at 08/12/15 0902    Musculoskeletal: Strength & Muscle Tone: decreased Gait & Station: unable to stand Patient leans: N/A  Psychiatric Specialty Exam: Review of Systems  Constitutional: Negative.   HENT: Negative.   Eyes: Negative.   Respiratory: Negative.   Cardiovascular: Negative.   Gastrointestinal: Negative.   Musculoskeletal: Negative.   Skin: Negative.   Neurological: Negative.   Psychiatric/Behavioral: Positive for memory loss. Negative for depression, suicidal ideas,  hallucinations and substance abuse. The patient is not nervous/anxious and does not have insomnia.     Blood pressure 128/77, pulse 80, temperature 97.5 F (36.4 C), temperature source Oral, resp. rate 18, height _0  (1.626 m), weight 58.968 kg (130 lb), SpO2 100 %.Body mass index is 22.3 kg/(m^2).  General Appearance: Disheveled  Eye Contact::  Poor  Speech:  Garbled  Volume:  Decreased  Mood:  Anxious  Affect:  Labile  Thought Process:  Coherent  Orientation:  Negative  Thought Content:  Negative  Suicidal Thoughts:  No  Homicidal Thoughts:  No  Memory:  Negative  Judgement:  Poor  Insight:  Shallow  Psychomotor Activity:  Decreased  Concentration:  Poor  Recall:  Poor  Fund of Knowledge:Poor  Language: Poor  Akathisia:  No  Handed:  Right  AIMS (if indicated):     Assets:  Desire for Improvement Physical Health Resilience  ADL's:  Intact  Cognition: WNL  Sleep:      Treatment Plan Summary: Daily contact with patient to assess and evaluate symptoms and progress in treatment, Medication management and Plan Patient appears to be a great deal better today. She still can't give much useful history. I had started her on Tegretol and some Valium yesterday which she seems to have tolerated well. Because of the dramatic improvement today I do not recommend or anticipate any other change to treatment today. I discussed with the patient our theories about the reason for her delirium and the treatment plan and she did not have any argument with any of it. Recommend that social work still look into seeing if they can contact her family. Glad to see that physical therapy is involved. I will follow up with her again tomorrow and see if she is continuing to get better. Supportive counseling and educational counseling completed.  Disposition: Patient does not meet criteria for psychiatric inpatient admission. Supportive therapy provided about ongoing stressors.  Charles Andringa 08/12/2015  2:59 PM

## 2015-08-12 NOTE — Evaluation (Signed)
Physical Therapy Evaluation Patient Details Name: Marilyn Brown MRN: [id number] DOB: Dec 11, 1970 Today's Date: 08/12/2015   History of Present Illness  Pt is a 44 yo female who was admitted to the hospital s/p fall [facility] that resulted in her hitting her head. Pt with extensive confusion following fall.   Clinical Impression  Pt presents with hx of anemia, anxiety, HTN, and depression. Examination reveals that pt performs bed mobility at mod I, transfers at mod I, and ambulation of 6 ft at [facility]. Pt does not show any signs of weakness limiting her ability to ambulate further. She mentions chronic pain in her RLE, but she states that the reason she can't walk further is due to fatigue. Of additional note was her gait pattern, which was an extremely narrow BOS with increased plantar flexion on RLE. Because of her gait and endurance deficits, she will continue to benefit from skilled PT to return her to optimal PLOF. Pt is a poor historian, and limited information is available at this time regarding the pt's therapy options -- or even full PLOF -- to make a tentative recommendation apart from the fact that she will benefit from skilled intervention for prevention of future falls. This entire session was guided, instructed, and directly supervised by Elizabeth Palau, DPT.     Follow Up Recommendations  (TBD given current living arrangement )    Equipment Recommendations  Rolling walker with 5" wheels    Recommendations for Other Services       Precautions / Restrictions Precautions Precautions: Fall Restrictions Weight Bearing Restrictions: No      Mobility  Bed Mobility Overal bed mobility: Modified Independent             General bed mobility comments: Pt uses side rails to get herself to EOB. No assist needed  Transfers Overall transfer level: Modified independent Equipment used: Rolling walker (2 wheeled)             General transfer comment: Pt transfers  stable with RW and with no LOB. When tried without RW she continuously grabbed for the counter and siderails of the bed.   Ambulation/Gait Ambulation/Gait assistance: Min guard Ambulation Distance (Feet): 6 Feet Assistive device: Rolling walker (2 wheeled) Gait Pattern/deviations: Step-to pattern;Decreased step length - right;Decreased step length - left;Decreased stride length;Narrow base of support (Increased plantarflexion R) Gait velocity: greatly decreased Gait velocity interpretation: <1.8 ft/sec, indicative of risk for recurrent falls General Gait Details: Pt steps with very guarded ambulation, although stable with RW. She does not buckle during ambulation however her BOS is very narrow and she ambulates with her toes touching down first at what should be heel strike. Pt states she typically wheres a brace for her ankle that she does not currently have. Following 6 ft of ambulation pt needing to take a break due to "fatigue". Pt did not want to perform any more ambulation at this time.   Stairs            Wheelchair Mobility    Modified Rankin (Stroke Patients Only)       Balance Overall balance assessment: History of Falls                                           Pertinent Vitals/Pain Pain Assessment:  (states pain in R ankle )    Home Living Family/patient expects to be  discharged to:: [facility] Living Arrangements:  (unknown)                    Prior Function Level of Independence: Independent         Comments: Per guards, pt with limited ambulation while at jail. Pt states she doesn't walk much because of the pain that she has had in her R ankle. Pain is chronic in nature.     Hand Dominance        Extremity/Trunk Assessment   Upper Extremity Assessment: Overall WFL for tasks assessed           Lower Extremity Assessment: Overall WFL for tasks assessed (Noted pain in RLE)         Communication    Communication: No difficulties  Cognition Arousal/Alertness: Awake/alert Behavior During Therapy: Flat affect Overall Cognitive Status: No family/caregiver present to determine baseline cognitive functioning (Not oriented to time/situation. Not oriented to her birthday)                      General Comments      Exercises        Assessment/Plan    PT Assessment Patient needs continued PT services  PT Diagnosis Difficulty walking;Abnormality of gait   PT Problem List Decreased activity tolerance;Decreased balance (Gait impairments)  PT Treatment Interventions DME instruction;Gait training;Stair training;Therapeutic activities;Therapeutic exercise;Functional mobility training;Balance training;Neuromuscular re-education;Cognitive remediation   PT Goals (Current goals can be found in the Care Plan section) Acute Rehab PT Goals Patient Stated Goal: none stated PT Goal Formulation: With patient Time For Goal Achievement: 08/26/15 Potential to Achieve Goals: Fair    Frequency Min 2X/week   Barriers to discharge        Co-evaluation               End of Session Equipment Utilized During Treatment: Gait belt Activity Tolerance: Patient limited by fatigue Patient left: in bed;with call bell/phone within reach;with bed alarm set (with officers ) Nurse Communication: Mobility status         Time: 1346-1400 PT Time Calculation (min) (ACUTE ONLY): 14 min   Charges:         PT G CodesBenna Dunks:        Marilyn Brown 08/12/2015, 2:31 PM  Benna Dunksasey Breunna Nordmann, SPT. [phone]

## 2015-08-12 NOTE — Progress Notes (Signed)
Patient ID: Marilyn Brown, female   DOB: 05-02-1971, 44 y.o.   MRN: [id number] [facility] PROGRESS NOTE  PCP: Mila Merry, MD  HPI/Subjective: Patient states she doesn't feel well. Scratchy throat and short of breath. She was also talking about dogs on a line. She also involve me in the story. Patient also stated she had right leg paralysis. Patient presented to the ER after a fall and had staples placed in the back of her head.   Objective: Filed Vitals:   08/12/15 0807  BP: 128/77  Pulse: 80  Temp: 97.5 F (36.4 C)  Resp: 18    Filed Weights   08/11/15 1033  Weight: 58.968 kg (130 lb)    ROS: Review of Systems  Constitutional: Negative for fever and chills.  Eyes: Negative for blurred vision.  Respiratory: Positive for shortness of breath. Negative for cough.   Cardiovascular: Negative for chest pain.  Gastrointestinal: Negative for nausea, vomiting, abdominal pain, diarrhea and constipation.  Genitourinary: Negative for dysuria.  Musculoskeletal: Negative for joint pain.  Neurological: Negative for dizziness and headaches.   Exam: Physical Exam  HENT:  Nose: No mucosal edema.  Mouth/Throat: No oropharyngeal exudate or posterior oropharyngeal edema.  Eyes: Conjunctivae, EOM and lids are normal. Pupils are equal, round, and reactive to light.  Neck: No JVD present. Carotid bruit is not present. No edema present. No thyroid mass and no thyromegaly present.  Cardiovascular: S1 normal and S2 normal.  Exam reveals no gallop.   No murmur heard. Pulses:      Dorsalis pedis pulses are 2+ on the right side, and 2+ on the left side.  Respiratory: No respiratory distress. She has no wheezes. She has no rhonchi. She has no rales.  GI: Soft. Bowel sounds are normal. There is no tenderness.  Musculoskeletal:       Right ankle: She exhibits swelling.       Left ankle: She exhibits swelling.  Lymphadenopathy:    She has no cervical adenopathy.   Neurological: She is alert. No cranial nerve deficit.  Patient stated that she had right leg paralysis. I was able to lift her right leg up off the bed and she was able to hold it up in the air.  Skin: Skin is warm. No rash noted. Nails show no clubbing.  Psychiatric: She has a normal mood and affect. Thought content is delusional.    Data Reviewed: Basic Metabolic Panel:  Recent Labs Lab 08/11/15 1307 08/12/15 0401  NA 138 139  K 3.2* 3.0*  CL 104 103  CO2 24 23  GLUCOSE 89 98  BUN 33* 28*  CREATININE 0.72 0.70  CALCIUM 9.5 9.2  MG  --  2.1   Liver Function Tests:  Recent Labs Lab 08/11/15 1307  AST 26  ALT 31  ALKPHOS 61  BILITOT 2.1*  PROT 8.0  ALBUMIN 4.7   CBC:  Recent Labs Lab 08/11/15 1307 08/12/15 0401  WBC 16.6* 12.8*  HGB 16.3* 16.2*  HCT 48.7* 46.6  MCV 99.5 99.2  PLT 143* 140*   Cardiac Enzymes:  Recent Labs Lab 08/11/15 1307  TROPONINI <0.03    Recent Results (from the past 240 hour(s))  MRSA PCR Screening     Status: None   Collection Time: 08/12/15  4:00 AM  Result Value Ref Range Status   MRSA by PCR NEGATIVE NEGATIVE Final    Comment:        The GeneXpert MRSA Assay (FDA approved for NASAL specimens  only), is one component of a comprehensive MRSA colonization surveillance program. It is not intended to diagnose MRSA infection nor to guide or monitor treatment for MRSA infections.      Studies: Ct Head Wo Contrast  08/11/2015  CLINICAL DATA:  Fall today, hitting head on floor. Laceration at the back of the head. No reported loss of consciousness. Initial encounter. EXAM: CT HEAD WITHOUT CONTRAST TECHNIQUE: Contiguous axial images were obtained from the base of the skull through the vertex without intravenous contrast. COMPARISON:  09/15/2014 FINDINGS: There is no evidence of acute intracranial hemorrhage, mass, midline shift, or extra-axial fluid collection. There is a small region of decreased attenuation extending to the  cortex in the left occipital lobe (series 2, images 11 and 12 and series 4, images 20 and 21 of the reformatted images), however this may be related to streak artifact and sulcal volume averaging. There is no evidence of edema elsewhere. Ventricles and sulci are normal. Orbits are unremarkable. No skull fracture is seen. Paranasal sinuses and mastoid air cells are clear. IMPRESSION: No hemorrhage or definite acute intracranial abnormality identified. Small region of possible low density in the left occipital lobe is favored to be artifactual although it is difficult to completely exclude mild edema due to recent ischemia or nonhemorrhagic contusion. Electronically Signed   By: Sebastian AcheAllen  Grady M.D.   On: 08/11/2015 12:14    Scheduled Meds: . amLODipine  5 mg Oral Daily  . atenolol  50 mg Oral BID  . carbamazepine  200 mg Oral BID AC  . diazepam  5 mg Oral TID  . enoxaparin (LOVENOX) injection  40 mg Subcutaneous Q24H  . gabapentin  1,200 mg Oral QHS  . gabapentin  900 mg Oral BID  . oxybutynin  15 mg Oral QHS  . topiramate  25 mg Oral BID   Continuous Infusions: . 0.9 % NaCl with KCl 20 mEq / L      Assessment/Plan:  1. Acute delirium- patient's thought content is still off at times. She does seem to answer questions appropriately. Continue to observe here today. Appreciate psychiatric consultation and restarting psychiatric medications just in case this is a medication withdrawal. I will get a neurology consultation. Patient seems to be on a very large dose of gabapentin. 2. Hypokalemia replace in IV fluids 3. Dehydration we'll give IV fluid hydration. 4. Essential hypertension continue amlodipine and atenolol 5. History of right leg weakness- patient was able to lift up her right leg without a problem 6. Fall with sutures in the back of the head. We'll get physical therapy evaluation  Code Status:     Code Status Orders        Start     Ordered   08/11/15 1756  Full code   Continuous      08/11/15 1756     Disposition Plan: Back to jail soon  Consultants:  Psychiatry  Neurology  Time spent: 22 minutes  Alford HighlandWIETING, Birgitta Uhlir  [facility]

## 2015-08-12 NOTE — Consult Note (Signed)
CC: syncopal event   HPI: Marilyn Brown is an 44 y.o. female  with a known history of with a known history of depression, hypertension, anxiety, chronic anemia who presented to the hospital from prison after sustaining a mechanical fall and noted to be confused. Pt states she knew she was falling but could not control it.  No seizure like activity .  CTH done ? Suspicion of L occipital lobe infarct.      Past Medical History  Diagnosis Date  . Anemia   . Anxiety   . Right leg weakness   . Hypertension   . Depression     History reviewed. No pertinent past surgical history.  Family History  Problem Relation Age of Onset  . Bipolar disorder Mother   . Hypertension Father     Social History:  reports that she has been smoking.  She does not have any smokeless tobacco history on file. She reports that she drinks alcohol. She reports that she does not use illicit drugs.  Allergies  Allergen Reactions  . Triamcinolone Swelling  . Prednisone Anxiety    Patient had swelling     Medications: I have reviewed the patient's current medications.  ROS: History obtained from the patient  General ROS: negative for - chills, fatigue, fever, night sweats, weight gain or weight loss Psychological ROS: negative for - behavioral disorder, hallucinations, memory difficulties, mood swings or suicidal ideation Ophthalmic ROS: negative for - blurry vision, double vision, eye pain or loss of vision ENT ROS: negative for - epistaxis, nasal discharge, oral lesions, sore throat, tinnitus or vertigo Allergy and Immunology ROS: negative for - hives or itchy/watery eyes Hematological and Lymphatic ROS: negative for - bleeding problems, bruising or swollen lymph nodes Endocrine ROS: negative for - galactorrhea, hair pattern changes, polydipsia/polyuria or temperature intolerance Respiratory ROS: negative for - cough, hemoptysis, shortness of breath or wheezing Cardiovascular ROS: negative for  - chest pain, dyspnea on exertion, edema or irregular heartbeat Gastrointestinal ROS: negative for - abdominal pain, diarrhea, hematemesis, nausea/vomiting or stool incontinence Genito-Urinary ROS: negative for - dysuria, hematuria, incontinence or urinary frequency/urgency Musculoskeletal ROS: negative for - joint swelling or muscular weakness Neurological ROS: as noted in HPI Dermatological ROS: negative for rash and skin lesion changes  Physical Examination: Blood pressure 112/56, pulse 64, temperature 99.4 F (37.4 C), temperature source Oral, resp. rate 16, height  (1.626 m), weight 130 lb (58.968 kg), SpO2 100 %.   Mental Status: Alert, oriented, thought content appropriate.  Speech fluent without evidence of aphasia.  Able to follow 3 step commands without difficulty. Cranial Nerves: II: Discs flat bilaterally; Visual fields grossly normal, pupils equal, round, reactive to light and accommodation III,IV, VI: ptosis not present, extra-ocular motions intact bilaterally V,VII: smile symmetric, facial light touch sensation normal bilaterally VIII: hearing normal bilaterally IX,X: gag reflex present XI: bilateral shoulder shrug XII: midline tongue extension Motor: Right : Upper extremity   5/5    Left:     Upper extremity   5/5  Lower extremity   4+/5     Lower extremity   4+/5 Tone and bulk:normal tone throughout; no atrophy noted Sensory: Pinprick and light touch intact throughout, bilaterally Deep Tendon Reflexes: 2+ and symmetric throughout Plantars: Right: downgoing   Left: downgoing Cerebellar: normal finger-to-nose, normal rapid alternating movements and normal heel-to-shin test Gait: not tested       Laboratory Studies:   Basic Metabolic Panel:  Recent Labs Lab 08/11/15 1307 08/12/15 0401  NA 138 139  K 3.2* 3.0*  CL 104 103  CO2 24 23  GLUCOSE 89 98  BUN 33* 28*  CREATININE 0.72 0.70  CALCIUM 9.5 9.2  MG  --  2.1    Liver Function  Tests:  Recent Labs Lab 08/11/15 1307  AST 26  ALT 31  ALKPHOS 61  BILITOT 2.1*  PROT 8.0  ALBUMIN 4.7   No results for input(s): LIPASE, AMYLASE in the last 168 hours. No results for input(s): AMMONIA in the last 168 hours.  CBC:  Recent Labs Lab 08/11/15 1307 08/12/15 0401  WBC 16.6* 12.8*  HGB 16.3* 16.2*  HCT 48.7* 46.6  MCV 99.5 99.2  PLT 143* 140*    Cardiac Enzymes:  Recent Labs Lab 08/11/15 1307  TROPONINI <0.03    BNP: Invalid input(s): POCBNP  CBG: No results for input(s): GLUCAP in the last 168 hours.  Microbiology: Results for orders placed or performed during the hospital encounter of 08/11/15  MRSA PCR Screening     Status: None   Collection Time: 08/12/15  4:00 AM  Result Value Ref Range Status   MRSA by PCR NEGATIVE NEGATIVE Final    Comment:        The GeneXpert MRSA Assay (FDA approved for NASAL specimens only), is one component of a comprehensive MRSA colonization surveillance program. It is not intended to diagnose MRSA infection nor to guide or monitor treatment for MRSA infections.     Coagulation Studies: No results for input(s): LABPROT, INR in the last 72 hours.  Urinalysis:  Recent Labs Lab 08/11/15 1306  COLORURINE AMBER*  LABSPEC 1.025  PHURINE 6.0  GLUCOSEU NEGATIVE  HGBUR 2+*  BILIRUBINUR NEGATIVE  KETONESUR 1+*  PROTEINUR 100*  NITRITE NEGATIVE  LEUKOCYTESUR NEGATIVE    Lipid Panel:  No results found for: CHOL, TRIG, HDL, CHOLHDL, VLDL, LDLCALC  HgbA1C:  Lab Results  Component Value Date   HGBA1C 5.3 09/16/2014    Urine Drug Screen:     Component Value Date/Time   LABOPIA NONE DETECTED 08/11/2015 1306   LABBENZ NONE DETECTED 08/11/2015 1306   AMPHETMU NONE DETECTED 08/11/2015 1306   THCU NONE DETECTED 08/11/2015 1306   LABBARB NONE DETECTED 08/11/2015 1306    Alcohol Level: No results for input(s): ETH in the last 168 hours.  Imaging: Ct Head Wo Contrast  08/11/2015  CLINICAL DATA:   Fall today, hitting head on floor. Laceration at the back of the head. No reported loss of consciousness. Initial encounter. EXAM: CT HEAD WITHOUT CONTRAST TECHNIQUE: Contiguous axial images were obtained from the base of the skull through the vertex without intravenous contrast. COMPARISON:  09/15/2014 FINDINGS: There is no evidence of acute intracranial hemorrhage, mass, midline shift, or extra-axial fluid collection. There is a small region of decreased attenuation extending to the cortex in the left occipital lobe (series 2, images 11 and 12 and series 4, images 20 and 21 of the reformatted images), however this may be related to streak artifact and sulcal volume averaging. There is no evidence of edema elsewhere. Ventricles and sulci are normal. Orbits are unremarkable. No skull fracture is seen. Paranasal sinuses and mastoid air cells are clear. IMPRESSION: No hemorrhage or definite acute intracranial abnormality identified. Small region of possible low density in the left occipital lobe is favored to be artifactual although it is difficult to completely exclude mild edema due to recent ischemia or nonhemorrhagic contusion. Electronically Signed   By: Sebastian AcheAllen  Grady M.D.   On: 08/11/2015 12:14  Assessment/Plan: 44 y.o. female  with a known history of with a known history of depression, hypertension, anxiety, chronic anemia who presented to the hospital from prison after sustaining a mechanical fall and noted to be confused. Pt states she knew she was falling but could not control it.  No seizure like activity .  CTH done ? Suspicion of L occipital lobe infarct.   The L occipital lobe infarct is likely an artifact Pt has a lot of give away weakness on examination  Strength in the legs improved Appreciate psychiatry evaluation Would obtain another CTH in AM to confirm artifact on previous imaging and then d/c planning Aaiden Depoy  08/12/2015, 5:58 PM

## 2015-08-12 NOTE — Clinical Social Work Note (Signed)
CSW consulted for the reason of patient being an inmate. CSW has reviewed patient's record and patient is confused and is a poor historian. Psychiatry has seen patient and will be making daily rounds. Patient's plan would be to return to jail once medically cleared. York SpanielMonica Aleksey Newbern MSW,LCSW [phone]

## 2015-08-12 NOTE — Clinical Social Work Note (Signed)
CSW spoke with patient's nurse and patient's nurse does not know why the consult for CSW would be needed at this time as patient will return to jail upon discharge. CSW signing off at this time. Please reconsult if necessary. York SpanielMonica Jasaun Carn MSW,LCSW [phone]

## 2015-08-12 NOTE — Progress Notes (Signed)
Okay to give Lovenox injection per Dr Renae GlossWieting.

## 2015-08-12 NOTE — Progress Notes (Signed)
Spoke with Dr. Tobi BastosPyreddy this morning about pt potassium of 3.0. Potassium 40 mEq x1 ordered.

## 2015-08-12 NOTE — Plan of Care (Signed)
Problem: Safety: Goal: Ability to remain free from injury will improve Outcome: Progressing Pt free from fall this shift.  Problem: Pain Managment: Goal: General experience of comfort will improve Outcome: Progressing Pain control with oral pain medications this shift.

## 2015-08-13 ENCOUNTER — Observation Stay

## 2015-08-13 DIAGNOSIS — R410 Disorientation, unspecified: Secondary | ICD-10-CM | POA: Diagnosis not present

## 2015-08-13 LAB — URINALYSIS COMPLETE WITH MICROSCOPIC (ARMC ONLY)
BILIRUBIN URINE: NEGATIVE
Bacteria, UA: NONE SEEN
GLUCOSE, UA: NEGATIVE mg/dL
Leukocytes, UA: NEGATIVE
NITRITE: NEGATIVE
Protein, ur: NEGATIVE mg/dL
SPECIFIC GRAVITY, URINE: 1.008 (ref 1.005–1.030)
pH: 7 (ref 5.0–8.0)

## 2015-08-13 MED ORDER — GABAPENTIN 300 MG PO CAPS
600.0000 mg | ORAL_CAPSULE | Freq: Two times a day (BID) | ORAL | Status: DC
Start: 2015-08-13 — End: 2015-08-15
  Administered 2015-08-13 – 2015-08-15 (×5): 600 mg via ORAL
  Filled 2015-08-13 (×5): qty 2

## 2015-08-13 MED ORDER — GABAPENTIN 300 MG PO CAPS
900.0000 mg | ORAL_CAPSULE | Freq: Every day | ORAL | Status: DC
  Administered 2015-08-13 – 2015-08-14 (×2): 900 mg via ORAL
  Filled 2015-08-13 (×2): qty 3

## 2015-08-13 MED ORDER — DIAZEPAM 2 MG PO TABS
2.0000 mg | ORAL_TABLET | Freq: Three times a day (TID) | ORAL | Status: DC
  Administered 2015-08-13 – 2015-08-14 (×4): 2 mg via ORAL
  Filled 2015-08-13 (×4): qty 1

## 2015-08-13 NOTE — Consult Note (Signed)
CC: syncopal event   HPI: Marilyn Brown is an 44 y.o. female  with a known history of with a known history of depression, hypertension, anxiety, chronic anemia who presented to the hospital from prison after sustaining a mechanical fall and noted to be confused. Pt states she knew she was falling but could not control it.  No seizure like activity .  CTH done ? Suspicion of L occipital lobe infarct.      Past Medical History  Diagnosis Date  . Anemia   . Anxiety   . Right leg weakness   . Hypertension   . Depression     History reviewed. No pertinent past surgical history.  Family History  Problem Relation Age of Onset  . Bipolar disorder Mother   . Hypertension Father     Social History:  reports that she has been smoking.  She does not have any smokeless tobacco history on file. She reports that she drinks alcohol. She reports that she does not use illicit drugs.  Allergies  Allergen Reactions  . Triamcinolone Swelling  . Prednisone Anxiety    Patient had swelling     Medications: I have reviewed the patient's current medications.  ROS: History obtained from the patient  General ROS: negative for - chills, fatigue, fever, night sweats, weight gain or weight loss Psychological ROS: negative for - behavioral disorder, hallucinations, memory difficulties, mood swings or suicidal ideation Ophthalmic ROS: negative for - blurry vision, double vision, eye pain or loss of vision ENT ROS: negative for - epistaxis, nasal discharge, oral lesions, sore throat, tinnitus or vertigo Allergy and Immunology ROS: negative for - hives or itchy/watery eyes Hematological and Lymphatic ROS: negative for - bleeding problems, bruising or swollen lymph nodes Endocrine ROS: negative for - galactorrhea, hair pattern changes, polydipsia/polyuria or temperature intolerance Respiratory ROS: negative for - cough, hemoptysis, shortness of breath or wheezing Cardiovascular ROS: negative for  - chest pain, dyspnea on exertion, edema or irregular heartbeat Gastrointestinal ROS: negative for - abdominal pain, diarrhea, hematemesis, nausea/vomiting or stool incontinence Genito-Urinary ROS: negative for - dysuria, hematuria, incontinence or urinary frequency/urgency Musculoskeletal ROS: negative for - joint swelling or muscular weakness Neurological ROS: as noted in HPI Dermatological ROS: negative for rash and skin lesion changes  Physical Examination: Blood pressure 113/97, pulse 56, temperature 98.2 F (36.8 C), temperature source Oral, resp. rate 16, height  (1.626 m), weight 130 lb (58.968 kg), SpO2 100 %.   Mental Status: Alert, oriented, thought content appropriate.  Speech fluent without evidence of aphasia.  Able to follow 3 step commands without difficulty. Cranial Nerves: II: Discs flat bilaterally; Visual fields grossly normal, pupils equal, round, reactive to light and accommodation III,IV, VI: ptosis not present, extra-ocular motions intact bilaterally V,VII: smile symmetric, facial light touch sensation normal bilaterally VIII: hearing normal bilaterally IX,X: gag reflex present XI: bilateral shoulder shrug XII: midline tongue extension Motor: Right : Upper extremity   5/5    Left:     Upper extremity   5/5  Lower extremity   4+/5     Lower extremity   4+/5 Tone and bulk:normal tone throughout; no atrophy noted Sensory: Pinprick and light touch intact throughout, bilaterally Deep Tendon Reflexes: 2+ and symmetric throughout Plantars: Right: downgoing   Left: downgoing Cerebellar: normal finger-to-nose, normal rapid alternating movements and normal heel-to-shin test Gait: not tested       Laboratory Studies:   Basic Metabolic Panel:  Recent Labs Lab 08/11/15 1307 08/12/15 0401  NA 138 139  K 3.2* 3.0*  CL 104 103  CO2 24 23  GLUCOSE 89 98  BUN 33* 28*  CREATININE 0.72 0.70  CALCIUM 9.5 9.2  MG  --  2.1    Liver Function  Tests:  Recent Labs Lab 08/11/15 1307  AST 26  ALT 31  ALKPHOS 61  BILITOT 2.1*  PROT 8.0  ALBUMIN 4.7   No results for input(s): LIPASE, AMYLASE in the last 168 hours. No results for input(s): AMMONIA in the last 168 hours.  CBC:  Recent Labs Lab 08/11/15 1307 08/12/15 0401  WBC 16.6* 12.8*  HGB 16.3* 16.2*  HCT 48.7* 46.6  MCV 99.5 99.2  PLT 143* 140*    Cardiac Enzymes:  Recent Labs Lab 08/11/15 1307  TROPONINI <0.03    BNP: Invalid input(s): POCBNP  CBG: No results for input(s): GLUCAP in the last 168 hours.  Microbiology: Results for orders placed or performed during the hospital encounter of 08/11/15  MRSA PCR Screening     Status: None   Collection Time: 08/12/15  4:00 AM  Result Value Ref Range Status   MRSA by PCR NEGATIVE NEGATIVE Final    Comment:        The GeneXpert MRSA Assay (FDA approved for NASAL specimens only), is one component of a comprehensive MRSA colonization surveillance program. It is not intended to diagnose MRSA infection nor to guide or monitor treatment for MRSA infections.     Coagulation Studies: No results for input(s): LABPROT, INR in the last 72 hours.  Urinalysis:   Recent Labs Lab 08/11/15 1306  COLORURINE AMBER*  LABSPEC 1.025  PHURINE 6.0  GLUCOSEU NEGATIVE  HGBUR 2+*  BILIRUBINUR NEGATIVE  KETONESUR 1+*  PROTEINUR 100*  NITRITE NEGATIVE  LEUKOCYTESUR NEGATIVE    Lipid Panel:  No results found for: CHOL, TRIG, HDL, CHOLHDL, VLDL, LDLCALC  HgbA1C:  Lab Results  Component Value Date   HGBA1C 5.3 09/16/2014    Urine Drug Screen:      Component Value Date/Time   LABOPIA NONE DETECTED 08/11/2015 1306   LABBENZ NONE DETECTED 08/11/2015 1306   AMPHETMU NONE DETECTED 08/11/2015 1306   THCU NONE DETECTED 08/11/2015 1306   LABBARB NONE DETECTED 08/11/2015 1306    Alcohol Level: No results for input(s): ETH in the last 168 hours.  Imaging: Ct Head Wo Contrast  08/11/2015  CLINICAL DATA:   Fall today, hitting head on floor. Laceration at the back of the head. No reported loss of consciousness. Initial encounter. EXAM: CT HEAD WITHOUT CONTRAST TECHNIQUE: Contiguous axial images were obtained from the base of the skull through the vertex without intravenous contrast. COMPARISON:  09/15/2014 FINDINGS: There is no evidence of acute intracranial hemorrhage, mass, midline shift, or extra-axial fluid collection. There is a small region of decreased attenuation extending to the cortex in the left occipital lobe (series 2, images 11 and 12 and series 4, images 20 and 21 of the reformatted images), however this may be related to streak artifact and sulcal volume averaging. There is no evidence of edema elsewhere. Ventricles and sulci are normal. Orbits are unremarkable. No skull fracture is seen. Paranasal sinuses and mastoid air cells are clear. IMPRESSION: No hemorrhage or definite acute intracranial abnormality identified. Small region of possible low density in the left occipital lobe is favored to be artifactual although it is difficult to completely exclude mild edema due to recent ischemia or nonhemorrhagic contusion. Electronically Signed   By: Sebastian AcheAllen  Grady M.D.   On:  08/11/2015 12:14     Assessment/Plan: 44 y.o. female  with a known history of with a known history of depression, hypertension, anxiety, chronic anemia who presented to the hospital from prison after sustaining a mechanical fall and noted to be confused. Pt states she knew she was falling but could not control it.  No seizure like activity .  CTH done ? Suspicion of L occipital lobe infarct.    Pt is going for [facility] now, if negative d/c today.    Pauletta Browns  08/13/2015, 9:41 AM
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
## 2015-08-13 NOTE — Progress Notes (Signed)
Patient ID: ADHYA COCCO, female   DOB: Feb 04, 1971, 44 y.o.   MRN: [id number] [facility] PROGRESS NOTE  PCP: Mila Merry, MD  HPI/Subjective: Patient states that she does not feel good. She states she feels weak. Today she does not have the delusions that she did have yesterday. Answers questions appropriately but less talkative today.   Objective: Filed Vitals:   08/13/15 0809  BP: 113/97  Pulse: 56  Temp: 98.2 F (36.8 C)  Resp: 16    Filed Weights   08/11/15 1033  Weight: 58.968 kg (130 lb)    ROS: Review of Systems  Constitutional: Positive for malaise/fatigue. Negative for fever and chills.  Eyes: Negative for blurred vision.  Respiratory: Positive for shortness of breath. Negative for cough.   Cardiovascular: Negative for chest pain.  Gastrointestinal: Positive for abdominal pain. Negative for nausea, vomiting, diarrhea and constipation.  Genitourinary: Negative for dysuria.  Musculoskeletal: Negative for joint pain.  Neurological: Negative for dizziness and headaches.   Exam: Physical Exam  Constitutional: She is oriented to person, place, and time.  HENT:  Nose: No mucosal edema.  Mouth/Throat: No oropharyngeal exudate or posterior oropharyngeal edema.  Eyes: Conjunctivae, EOM and lids are normal. Pupils are equal, round, and reactive to light.  Neck: No JVD present. Carotid bruit is not present. No edema present. No thyroid mass and no thyromegaly present.  Cardiovascular: S1 normal and S2 normal.  Exam reveals no gallop.   No murmur heard. Pulses:      Dorsalis pedis pulses are 2+ on the right side, and 2+ on the left side.  Respiratory: No respiratory distress. She has no wheezes. She has no rhonchi. She has no rales.  GI: Soft. Bowel sounds are normal. There is no tenderness.  Musculoskeletal:       Right ankle: She exhibits swelling.       Left ankle: She exhibits swelling.  Lymphadenopathy:    She has no cervical adenopathy.   Neurological: She is alert and oriented to person, place, and time. No cranial nerve deficit.  Skin: Skin is warm. No rash noted. Nails show no clubbing.  Psychiatric: She has a normal mood and affect.    Data Reviewed: Basic Metabolic Panel:  Recent Labs Lab 08/11/15 1307 08/12/15 0401  NA 138 139  K 3.2* 3.0*  CL 104 103  CO2 24 23  GLUCOSE 89 98  BUN 33* 28*  CREATININE 0.72 0.70  CALCIUM 9.5 9.2  MG  --  2.1   Liver Function Tests:  Recent Labs Lab 08/11/15 1307  AST 26  ALT 31  ALKPHOS 61  BILITOT 2.1*  PROT 8.0  ALBUMIN 4.7   CBC:  Recent Labs Lab 08/11/15 1307 08/12/15 0401  WBC 16.6* 12.8*  HGB 16.3* 16.2*  HCT 48.7* 46.6  MCV 99.5 99.2  PLT 143* 140*   Cardiac Enzymes:  Recent Labs Lab 08/11/15 1307  TROPONINI <0.03    Recent Results (from the past 240 hour(s))  MRSA PCR Screening     Status: None   Collection Time: 08/12/15  4:00 AM  Result Value Ref Range Status   MRSA by PCR NEGATIVE NEGATIVE Final    Comment:        The GeneXpert MRSA Assay (FDA approved for NASAL specimens only), is one component of a comprehensive MRSA colonization surveillance program. It is not intended to diagnose MRSA infection nor to guide or monitor treatment for MRSA infections.      Studies: Dg Chest  1 View  08/13/2015  CLINICAL DATA:  Cough, hypertension, smoking history. EXAM: CHEST 1 VIEW COMPARISON:  None. FINDINGS: The heart size and mediastinal contours are within normal limits. Both lungs are clear. The visualized skeletal structures are unremarkable. IMPRESSION: No active disease. Electronically Signed   By: Bary RichardStan  Maynard M.D.   On: 08/13/2015 12:57   Ct Head Wo Contrast  08/13/2015  CLINICAL DATA:  44 year old female with acute confusion following fall. EXAM: CT HEAD WITHOUT CONTRAST TECHNIQUE: Contiguous axial images were obtained from the base of the skull through the vertex without intravenous contrast. COMPARISON:  08/11/2015  FINDINGS: No intracranial abnormalities are identified, including mass lesion or mass effect, hydrocephalus, extra-axial fluid collection, midline shift, hemorrhage, or acute infarction. The visualized bony calvarium is unremarkable. Surgical staples along the posterior right scalp noted. IMPRESSION: Unremarkable noncontrast head CT except for right scalp surgical staples. Electronically Signed   By: Harmon PierJeffrey  Hu M.D.   On: 08/13/2015 09:43    Scheduled Meds: . amLODipine  5 mg Oral Daily  . atenolol  50 mg Oral BID  . carbamazepine  200 mg Oral BID AC  . diazepam  2 mg Oral TID  . gabapentin  600 mg Oral BID  . gabapentin  900 mg Oral QHS  . oxybutynin  15 mg Oral QHS  . topiramate  25 mg Oral BID   Continuous Infusions: . 0.9 % NaCl with KCl 20 mEq / L 50 mL/hr at 08/13/15 0448    Assessment/Plan:  1. Acute delirium- I spoke with the nurse at the jail and also Dr. Delaney Meigslaypacs psychiatry. We are not allowed to use controlled substances such as Valium and/or Xanax at the jail. We will taper down Valium over the next couple days to off. Thought content seems better today and not having delusions. 2. Low-grade fever overnight- chest x-ray today negative, urinalysis on admission negative. Hold off on antibiotics at this point 3. Hypokalemia replace in IV fluids 4. Dehydration we'll give IV fluid hydration. 5. Essential hypertension continue amlodipine and atenolol 6. History of right leg weakness- patient was able to lift up her right leg without a problem 7. Fall with sutures in the back of the head. We'll get physical therapy evaluation 8. In speaking with the nurse at the jail she may be released from custody in the next 24 hours. I will consult care management for disposition home at some point.  Code Status:     Code Status Orders        Start     Ordered   08/11/15 1756  Full code   Continuous     08/11/15 1756     Disposition Plan: Back to jail versus home after taper off of  controlled substances.  Consultants:  Psychiatry  Neurology  Time spent: 25 minutes  Alford HighlandWIETING, Gorden Stthomas  [facility]

## 2015-08-13 NOTE — Progress Notes (Signed)
Pt noted to be a confused after xanax this afternoon. Patient has become more clear Called Dr Renae GlossWieting received order to discontinue med. Pt urinating frequently and has a strong odor UA ordered. Patient aware of plan of care.

## 2015-08-14 ENCOUNTER — Encounter: Payer: Self-pay | Admitting: Radiology

## 2015-08-14 ENCOUNTER — Observation Stay

## 2015-08-14 LAB — BASIC METABOLIC PANEL
Anion gap: 5 (ref 5–15)
BUN: 12 mg/dL (ref 6–20)
CALCIUM: 8.5 mg/dL — AB (ref 8.9–10.3)
CO2: 22 mmol/L (ref 22–32)
CREATININE: 0.6 mg/dL (ref 0.44–1.00)
Chloride: 110 mmol/L (ref 101–111)
GFR calc Af Amer: >60 mL/min (ref >60)
GFR calc non Af Amer: >60 mL/min (ref >60)
GLUCOSE: 112 mg/dL — AB (ref 65–99)
Potassium: 3.7 mmol/L (ref 3.5–5.1)
Sodium: 137 mmol/L (ref 135–145)

## 2015-08-14 LAB — CBC
HCT: 43.1 % (ref 35.0–47.0)
Hemoglobin: 14.9 g/dL (ref 12.0–16.0)
MCH: 34.7 pg — AB (ref 26.0–34.0)
MCHC: 34.5 g/dL (ref 32.0–36.0)
MCV: 100.8 fL — AB (ref 80.0–100.0)
PLATELETS: 130 10*3/uL — AB (ref 150–440)
RBC: 4.28 MIL/uL (ref 3.80–5.20)
RDW: 12.8 % (ref 11.5–14.5)
WBC: 9.7 10*3/uL (ref 3.6–11.0)

## 2015-08-14 LAB — MAGNESIUM: Magnesium: 2 mg/dL (ref 1.7–2.4)

## 2015-08-14 MED ORDER — IOHEXOL 240 MG/ML SOLN
25.0000 mL | INTRAMUSCULAR | Status: AC
  Administered 2015-08-14 (×2): 25 mL via ORAL

## 2015-08-14 MED ORDER — DIAZEPAM 2 MG PO TABS
1.0000 mg | ORAL_TABLET | Freq: Three times a day (TID) | ORAL | Status: DC
  Administered 2015-08-14 – 2015-08-15 (×3): 1 mg via ORAL
  Filled 2015-08-14 (×3): qty 1

## 2015-08-14 MED ORDER — SODIUM CHLORIDE 0.9 % IV SOLN
INTRAVENOUS | Status: DC
  Administered 2015-08-14: 11:00:00 via INTRAVENOUS

## 2015-08-14 MED ORDER — AMPICILLIN-SULBACTAM SODIUM 3 (2-1) G IJ SOLR
3.0000 g | Freq: Three times a day (TID) | INTRAMUSCULAR | Status: DC
  Administered 2015-08-14 – 2015-08-15 (×3): 3 g via INTRAVENOUS
  Filled 2015-08-14 (×4): qty 3

## 2015-08-14 MED ORDER — IOHEXOL 300 MG/ML  SOLN
100.0000 mL | Freq: Once | INTRAMUSCULAR | Status: AC | PRN
  Administered 2015-08-14: 100 mL via INTRAVENOUS

## 2015-08-14 NOTE — Telephone Encounter (Signed)
[facility]

## 2015-08-14 NOTE — Care Management Note (Signed)
Case Management Note  Patient Details  Name: Marilyn Brown MRN: [id number] Date of Birth: December 24, 1970  Subjective/Objective:  Called the [facility] [phone], and spoke with Marilyn Brown. Marilyn Brown is aware that Marilyn Brown will most lilkly be discharged later today or tomorrow morning back to the jail. Chase reports that he will contact Marilyn Brown's family and request that they bring her walker from home to the jail.                  Action/Plan:   Expected Discharge Date:  08/13/15               Expected Discharge Plan:     In-House Referral:     Discharge planning Services     Post Acute Care Choice:    Choice offered to:     DME Arranged:    DME Agency:     HH Arranged:    HH Agency:     Status of Service:     Medicare Important Message Given:    Date Medicare IM Given:    Medicare IM give by:    Date Additional Medicare IM Given:    Additional Medicare Important Message give by:     If discussed at Long Length of Stay Meetings, dates discussed:    Additional Comments:  Marilyn Affinito A, RN 08/14/2015, 9:29 AM

## 2015-08-14 NOTE — Progress Notes (Addendum)
   08/14/15 1930  Clinical Encounter Type  Visited With Patient  Visit Type Initial  Consult/Referral To Chaplain  Spiritual Encounters  Spiritual Needs Emotional  Chaplain visited with patient who was visibly upset. She stated she had no where to go after her discharge on tomorrow. I informed her that she could speak to her case manager to see if she could assist in finding her a placement. I informed her that it was not a guarantee she could find her one but she could speak to her about her situation.    Chaplain Makayah Pauli [phone]

## 2015-08-14 NOTE — Progress Notes (Signed)
Physical Therapy Treatment Patient Details Name: Marilyn Brown MRN: [id number] DOB: Nov 02, 1970 Today's Date: 08/14/2015    History of Present Illness Pt is a 44 yo female who was admitted to the hospital s/p fall [facility] at prison that resulted in her hitting her head. Pt with extensive confusion following fall.     PT Comments    Pt unable to recall events of fall but states she has not had any other falls since December 2015. Pt has foot drop at baseline and uses AFO at home but does not have here in hospital. She ambulates without an assistive device but has a rolling walker at home. Pt demonstrates no active dorsiflexion or plantarflexion in RLE on this date. Pt also with decreased R knee flexion strength. She reports sciatic nerve pain posterior knee and low back with passive R ankle DF and refuses to place R foot on ground because of this issue. Pt able to safely ambulate with rolling walker and NWB RLE without overt LOB. She will need a walker at the jail if she continues to be unable to bear weight through RLE due to pain. Patient has a rolling walker at home and jail will need to contact family/friends to deliver. Discharge planning team notified. Pt will need follow-up with PCP regarding R sciatic nerve pain and limitations on mobility. No PT follow-up after discharge. Pt will benefit from skilled PT services while at [facility] to address deficits in strength, balance, and mobility in order to return to full function at home/jail.    Follow Up Recommendations  No PT follow up (Follow up with MD regarding sciatic pain with R ankle DF)     Equipment Recommendations  Rolling walker with 5" wheels (Has RW at home. Jail needs to call family/friends to deliver)    Recommendations for Other Services       Precautions / Restrictions Precautions Precautions: Fall Restrictions Weight Bearing Restrictions: No    Mobility  Bed Mobility Overal bed mobility: Modified Independent              General bed mobility comments: Pt uses side rails to get herself to EOB. No assist needed  Transfers Overall transfer level: Modified independent Equipment used: Rolling walker (2 wheeled)             General transfer comment: Sit to stand with good sequencinng, strength, and speed  Ambulation/Gait Ambulation/Gait assistance: Min guard Ambulation Distance (Feet): 75 Feet Assistive device: Rolling walker (2 wheeled)   Gait velocity: Decreased Gait velocity interpretation: <1.8 ft/sec, indicative of risk for recurrent falls General Gait Details: Pt with inability to bear weight through RLE due to increased sciatic nerve pain with R ankle dorsiflexion. Pt performs ambulation with hop-to gait with rolling walker and NWB on RLE. Pt does have a history of R foot drop which is corrected with an AFO however pt does not have brace available at this time. Would not tolerate WB through RLE regardless due to pain. Pt demonstrates good stability and LLE strength as well as good sequencing with rolling walker. No overt LOB with rolling walker   Stairs            Wheelchair Mobility    Modified Rankin (Stroke Patients Only)       Balance Overall balance assessment:  (Unable to assess due to pain with RLE weight bearing)  Cognition Arousal/Alertness: Awake/alert Behavior During Therapy: Flat affect (tearful) Overall Cognitive Status: No family/caregiver present to determine baseline cognitive functioning (Pt appears slightly confused, difficulty with short term)       Memory: Decreased short-term memory (Unable to recall details of fall, length of time in jail)              Exercises      General Comments        Pertinent Vitals/Pain Pain Assessment: 0-10 Pain Score: 7  Pain Location: R ear and R scalp Pain Intervention(s): Monitored during session    Home Living                      Prior Function             PT Goals (current goals can now be found in the care plan section) Acute Rehab PT Goals Patient Stated Goal: "I want to walk" PT Goal Formulation: With patient Time For Goal Achievement: 08/28/15 Progress towards PT goals: Progressing toward goals    Frequency  Min 2X/week    PT Plan Current plan remains appropriate    Co-evaluation             End of Session Equipment Utilized During Treatment: Gait belt Activity Tolerance: Patient limited by pain Patient left: in bed;with call bell/phone within reach;with bed alarm set Marilyn Brown(sheriff deputies present)     Time: [id number] PT Time Calculation (min) (ACUTE ONLY): 14 min  Charges:  $Gait Training: 8-22 mins                    G Codes:      Marilyn Brown D Leinaala Catanese PT, DPT   Marilyn Brown 08/14/2015, 9:39 AM

## 2015-08-14 NOTE — Progress Notes (Signed)
Patient ID: Marilyn Brown, female   DOB: 1970/10/05, 44 y.o.   MRN: [id number] [facility] PROGRESS NOTE  PCP: Mila Merry, MD  HPI/Subjective: Patient states that she does not feel good. Patient states that she can't hear out of her right ear. She had one episode of diarrhea yesterday. Having lower abdominal pain which is persistent.   Objective: Filed Vitals:   08/14/15 0811  BP: 132/69  Pulse: 66  Temp: 98.6 F (37 C)  Resp: 18    Filed Weights   08/11/15 1033  Weight: 58.968 kg (130 lb)    ROS: Review of Systems  Constitutional: Positive for malaise/fatigue. Negative for fever and chills.  HENT: Positive for hearing loss.   Eyes: Negative for blurred vision.  Respiratory: Positive for shortness of breath. Negative for cough.   Cardiovascular: Negative for chest pain.  Gastrointestinal: Positive for abdominal pain. Negative for nausea, vomiting, diarrhea and constipation.  Genitourinary: Negative for dysuria.  Musculoskeletal: Negative for joint pain.  Neurological: Negative for dizziness and headaches.   Exam: Physical Exam  Constitutional: She is oriented to person, place, and time.  HENT:  Nose: No mucosal edema.  Mouth/Throat: No oropharyngeal exudate or posterior oropharyngeal edema.  Eyes: Conjunctivae, EOM and lids are normal. Pupils are equal, round, and reactive to light.  Neck: No JVD present. Carotid bruit is not present. No edema present. No thyroid mass and no thyromegaly present.  Cardiovascular: S1 normal and S2 normal.  Exam reveals no gallop.   No murmur heard. Pulses:      Dorsalis pedis pulses are 2+ on the right side, and 2+ on the left side.  Respiratory: No respiratory distress. She has no wheezes. She has no rhonchi. She has no rales.  GI: Soft. Bowel sounds are normal. There is tenderness in the right lower quadrant and suprapubic area.  Musculoskeletal:       Right ankle: She exhibits swelling.       Left ankle: She  exhibits swelling.  Lymphadenopathy:    She has no cervical adenopathy.  Neurological: She is alert and oriented to person, place, and time. No cranial nerve deficit.  Skin: Skin is warm. No rash noted. Nails show no clubbing.  Psychiatric: She has a normal mood and affect.    Data Reviewed: Basic Metabolic Panel:  Recent Labs Lab 08/11/15 1307 08/12/15 0401 08/14/15 0447  NA 138 139 137  K 3.2* 3.0* 3.7  CL 104 103 110  CO2 GLUCOSE 89 98 112*  BUN 33* 28* 12  CREATININE 0.72 0.70 0.60  CALCIUM 9.5 9.2 8.5*  MG  --  2.1 2.0   Liver Function Tests:  Recent Labs Lab 08/11/15 1307  AST 26  ALT 31  ALKPHOS 61  BILITOT 2.1*  PROT 8.0  ALBUMIN 4.7   CBC:  Recent Labs Lab 08/11/15 1307 08/12/15 0401 08/14/15 0447  WBC 16.6* 12.8* 9.7  HGB 16.3* 16.2* 14.9  HCT 48.7* 46.6 43.1  MCV 99.5 99.2 100.8*  PLT 143* 140* 130*   Cardiac Enzymes:  Recent Labs Lab 08/11/15 1307  TROPONINI <0.03    Recent Results (from the past 240 hour(s))  MRSA PCR Screening     Status: None   Collection Time: 08/12/15  4:00 AM  Result Value Ref Range Status   MRSA by PCR NEGATIVE NEGATIVE Final    Comment:        The GeneXpert MRSA Assay (FDA approved for NASAL specimens only), is one  component of a comprehensive MRSA colonization surveillance program. It is not intended to diagnose MRSA infection nor to guide or monitor treatment for MRSA infections.   Urine culture     Status: None (Preliminary result)   Collection Time: 08/13/15 10:00 PM  Result Value Ref Range Status   Specimen Description URINE, CLEAN CATCH  Final   Special Requests NONE  Final   Culture TOO YOUNG TO READ  Final   Report Status PENDING  Incomplete     Studies: Dg Chest 1 View  08/13/2015  CLINICAL DATA:  Cough, hypertension, smoking history. EXAM: CHEST 1 VIEW COMPARISON:  None. FINDINGS: The heart size and mediastinal contours are within normal limits. Both lungs are clear. The  visualized skeletal structures are unremarkable. IMPRESSION: No active disease. Electronically Signed   By: Bary RichardStan  Maynard M.D.   On: 08/13/2015 12:57   Ct Head Wo Contrast  08/13/2015  CLINICAL DATA:  44 year old female with acute confusion following fall. EXAM: CT HEAD WITHOUT CONTRAST TECHNIQUE: Contiguous axial images were obtained from the base of the skull through the vertex without intravenous contrast. COMPARISON:  08/11/2015 FINDINGS: No intracranial abnormalities are identified, including mass lesion or mass effect, hydrocephalus, extra-axial fluid collection, midline shift, hemorrhage, or acute infarction. The visualized bony calvarium is unremarkable. Surgical staples along the posterior right scalp noted. IMPRESSION: Unremarkable noncontrast head CT except for right scalp surgical staples. Electronically Signed   By: Harmon PierJeffrey  Hu M.D.   On: 08/13/2015 09:43    Scheduled Meds: . amLODipine  5 mg Oral Daily  . ampicillin-sulbactam (UNASYN) IV  3 g Intravenous Q8H  . atenolol  50 mg Oral BID  . carbamazepine  200 mg Oral BID AC  . diazepam  2 mg Oral TID  . gabapentin  600 mg Oral BID  . gabapentin  900 mg Oral QHS  . oxybutynin  15 mg Oral QHS  . topiramate  25 mg Oral BID   Continuous Infusions: . sodium chloride      Assessment/Plan:  1. Acute delirium- patient improving. Unfortunately we are unable to do any controlled substances at the jail. Dr. Delaney Meigslaypacs psychiatrist has started tapering down the Valium 2 mg 3 times a day. 2. Abdominal pain right lower quadrant and suprapubic, otitis media bilaterally- I will start IV Unasyn and obtain a CT scan of the abdomen and pelvis. 3. Hypokalemia replaced 4. Essential hypertension continue amlodipine and atenolol 5. History of right leg weakness, history of foot drop.- patient was able to lift up her right leg without a problem 6. Fall with sutures in the back of the head. We'll get physical therapy evaluation   Code Status:      Code Status Orders        Start     Ordered   08/11/15 1756  Full code   Continuous     08/11/15 1756     Disposition Plan: Back to jail versus home after taper off of controlled substances.  Consultants:  Psychiatry  Neurology  Time spent: 20 minutes  Alford HighlandWIETING, Baltazar Pekala  [facility]

## 2015-08-14 NOTE — Consult Note (Signed)
  Psychiatry: Follow-up for this 44 year old woman who came into the hospital delirious. I have been following her over the weekend. Today I found the patient drowsy but arousable. She knows where she is. She continues to state that she can't remember where she was before coming into the hospital. I have told her multiple times and it is quite obvious from her surroundings that she is in jail or was in jail before coming here so not sure if this is still the delirium or her just being in denial.  Patient does know now that she is in the hospital she was oriented and able to have a conversation this evening. Not having any hallucinations.  No change to diagnosis. 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 Plan today is to cut the Valium down to 1 mg twice a day. Patient is to continue working on improving her ambulation and strength as we will hopefully be able to discharge her fairly soon.

## 2015-08-15 ENCOUNTER — Emergency Department
Admission: EM | Admit: 2015-08-15 | Discharge: 2015-08-16 | Disposition: A | Discharge status: Home / Self Care | Attending: Emergency Medicine | Admitting: Emergency Medicine

## 2015-08-15 ENCOUNTER — Emergency Department

## 2015-08-15 ENCOUNTER — Telehealth: Payer: Self-pay | Admitting: Family Medicine

## 2015-08-15 ENCOUNTER — Encounter: Payer: Self-pay | Admitting: Emergency Medicine

## 2015-08-15 DIAGNOSIS — F17200 Nicotine dependence, unspecified, uncomplicated: Secondary | ICD-10-CM | POA: Insufficient documentation

## 2015-08-15 DIAGNOSIS — Z792 Long term (current) use of antibiotics: Secondary | ICD-10-CM | POA: Insufficient documentation

## 2015-08-15 DIAGNOSIS — R519 Headache, unspecified: Secondary | ICD-10-CM

## 2015-08-15 DIAGNOSIS — R51 Headache: Secondary | ICD-10-CM

## 2015-08-15 DIAGNOSIS — Y998 Other external cause status: Secondary | ICD-10-CM | POA: Insufficient documentation

## 2015-08-15 DIAGNOSIS — S0990XA Unspecified injury of head, initial encounter: Secondary | ICD-10-CM | POA: Insufficient documentation

## 2015-08-15 DIAGNOSIS — W01198A Fall on same level from slipping, tripping and stumbling with subsequent striking against other object, initial encounter: Secondary | ICD-10-CM | POA: Insufficient documentation

## 2015-08-15 DIAGNOSIS — Y9289 Other specified places as the place of occurrence of the external cause: Secondary | ICD-10-CM | POA: Insufficient documentation

## 2015-08-15 DIAGNOSIS — I10 Essential (primary) hypertension: Secondary | ICD-10-CM | POA: Insufficient documentation

## 2015-08-15 DIAGNOSIS — Z3202 Encounter for pregnancy test, result negative: Secondary | ICD-10-CM | POA: Insufficient documentation

## 2015-08-15 DIAGNOSIS — Y9389 Activity, other specified: Secondary | ICD-10-CM | POA: Insufficient documentation

## 2015-08-15 DIAGNOSIS — Z79899 Other long term (current) drug therapy: Secondary | ICD-10-CM | POA: Insufficient documentation

## 2015-08-15 LAB — URINALYSIS COMPLETE WITH MICROSCOPIC (ARMC ONLY)
Bacteria, UA: NONE SEEN
Bilirubin Urine: NEGATIVE
GLUCOSE, UA: NEGATIVE mg/dL
Hgb urine dipstick: NEGATIVE
Leukocytes, UA: NEGATIVE
Nitrite: NEGATIVE
PH: 6 (ref 5.0–8.0)
PROTEIN: NEGATIVE mg/dL
RBC / HPF: NONE SEEN RBC/hpf (ref 0–5)
Specific Gravity, Urine: 1.005 (ref 1.005–1.030)

## 2015-08-15 LAB — CBC WITH DIFFERENTIAL/PLATELET
BASOS ABS: 0.1 10*3/uL (ref 0–0.1)
Basophils Relative: 1 %
Eosinophils Absolute: 0.1 10*3/uL (ref 0–0.7)
Eosinophils Relative: 2 %
HEMATOCRIT: 42.1 % (ref 35.0–47.0)
HEMOGLOBIN: 14.2 g/dL (ref 12.0–16.0)
LYMPHS PCT: 22 %
Lymphs Abs: 2.2 10*3/uL (ref 1.0–3.6)
MCH: 33.4 pg (ref 26.0–34.0)
MCHC: 33.7 g/dL (ref 32.0–36.0)
MCV: 99.3 fL (ref 80.0–100.0)
MONO ABS: 0.7 10*3/uL (ref 0.2–0.9)
Monocytes Relative: 7 %
NEUTROS ABS: 6.8 10*3/uL — AB (ref 1.4–6.5)
NEUTROS PCT: 68 %
Platelets: 135 10*3/uL — ABNORMAL LOW (ref 150–440)
RBC: 4.24 MIL/uL (ref 3.80–5.20)
RDW: 12.9 % (ref 11.5–14.5)
WBC: 9.9 10*3/uL (ref 3.6–11.0)

## 2015-08-15 LAB — COMPREHENSIVE METABOLIC PANEL
ALBUMIN: 3.9 g/dL (ref 3.5–5.0)
ALT: 22 U/L (ref 14–54)
ANION GAP: 9 (ref 5–15)
AST: 17 U/L (ref 15–41)
Alkaline Phosphatase: 63 U/L (ref 38–126)
BUN: 7 mg/dL (ref 6–20)
CHLORIDE: 105 mmol/L (ref 101–111)
CO2: 22 mmol/L (ref 22–32)
Calcium: 8.9 mg/dL (ref 8.9–10.3)
Creatinine, Ser: 0.54 mg/dL (ref 0.44–1.00)
GFR calc Af Amer: >60 mL/min (ref >60)
GLUCOSE: 91 mg/dL (ref 65–99)
POTASSIUM: 3 mmol/L — AB (ref 3.5–5.1)
Sodium: 136 mmol/L (ref 135–145)
Total Bilirubin: 1.2 mg/dL (ref 0.3–1.2)
Total Protein: 6.8 g/dL (ref 6.5–8.1)

## 2015-08-15 LAB — URINE CULTURE

## 2015-08-15 LAB — PREGNANCY, URINE: PREG TEST UR: NEGATIVE

## 2015-08-15 LAB — TROPONIN I

## 2015-08-15 MED ORDER — DIAZEPAM 2 MG PO TABS: ORAL_TABLET | ORAL | Status: DC

## 2015-08-15 MED ORDER — AMOXICILLIN-POT CLAVULANATE 875-125 MG PO TABS
1.0000 | ORAL_TABLET | Freq: Two times a day (BID) | ORAL | Status: DC
  Administered 2015-08-15: 1 via ORAL
  Filled 2015-08-15: qty 1

## 2015-08-15 MED ORDER — GABAPENTIN 600 MG PO TABS
ORAL_TABLET | ORAL | Status: DC
Start: 2015-08-15 — End: 2017-11-03

## 2015-08-15 MED ORDER — CARBAMAZEPINE 200 MG PO TABS: 200.0000 mg | ORAL_TABLET | Freq: Two times a day (BID) | ORAL | Status: DC

## 2015-08-15 MED ORDER — AMOXICILLIN-POT CLAVULANATE 875-125 MG PO TABS: 1.0000 | ORAL_TABLET | Freq: Two times a day (BID) | ORAL | Status: DC

## 2015-08-15 NOTE — ED Provider Notes (Signed)
[facility] Emergency Department Provider Note  ____________________________________________  Time seen: Approximately 10:25 PM  I have reviewed the triage vital signs and the nursing notes.   HISTORY  Chief Complaint Headache    HPI Marilyn Brown is a 44 y.o. female patient reports her head feels "cartoonist". She says when she lays back she feels like her head could go back and back and back and back again it's". Patient says she thinks she may be discharged to sooner from the hospital. She said she fell and hit her head for unknown reasons several days ago. Patient also told the nurse she has a headache she did not say that to me. 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 Patient did fall and hit her head and had staples. CT of the head was negative on the 13th of this month. Patient reports her right leg has been "paralyzed in the foot" since she passed out and laid on her right side on the sciatic nerve for quite some time several years ago.  Past Medical History  Diagnosis Date  . Anemia   . Anxiety   . Right leg weakness   . Hypertension   . Depression     Patient Active Problem List   Diagnosis Date Noted  . Delirium 08/11/2015  . Altered mental status 08/11/2015  . Depression 08/04/2015  . Accumulation of fluid in tissues 05/04/2015  . Bulge of lumbar disc without myelopathy 05/04/2015  . Contusion 05/04/2015  . Neuropathy (HCC) 05/04/2015  . Neuralgia neuritis, sciatic nerve 05/04/2015  . Anxiety 04/11/2015  . BP (high blood pressure) 04/11/2015  . Lumbar radiculopathy 09/26/2014  . Deliberate medication overdose (HCC) 09/15/2014  . Polypharmacy 05/27/2014  . Displacement of lumbar intervertebral disc without myelopathy 11/26/2013  . Deflected nasal septum 06/29/2013  . Cephalalgia 06/29/2013  . Allergic rhinitis 06/29/2013  . Nasal obstruction 06/29/2013  .  DDD (degenerative disc disease), lumbosacral 02/23/2013  . FOM (frequency of micturition) 04/10/2012  . Cervical pain 01/17/2012  . Thoracic and lumbosacral neuritis 12/27/2011  . Abnormal toxicological findings 04/25/2011  . Excess, menstruation 02/08/2011  . Disorder of sacrum 12/05/2010  . Menopausal symptom 05/01/2010  . Compulsive tobacco user syndrome 04/27/2010  . Hemorrhage of gastrointestinal tract 03/03/2009  . Absolute anemia 03/01/2009  . Fatty metamorphosis of liver 02/27/2009  . Disorder of teeth and supporting structures 08/25/2007  . Essential (primary) hypertension 10/20/2006  . Insomnia 09/30/2002  . Anxiety disorder 09/30/1998  . Cervical dysplasia 09/30/1998    History reviewed. No pertinent past surgical history.  Current Outpatient Rx  Name  Route  Sig  Dispense  Refill  . amLODipine (NORVASC) 5 MG tablet   Oral   Take 1 tablet (5 mg total) by mouth daily.   30 tablet   1   . amoxicillin-clavulanate (AUGMENTIN) 875-125 MG tablet   Oral   Take 1 tablet by mouth every 12 (twelve) hours.   18 tablet   0   . atenolol (TENORMIN) 50 MG tablet   Oral   Take 1 tablet (50 mg total) by mouth 2 (two) times daily.   180 tablet   1   . baclofen (LIORESAL) 10 MG tablet   Oral   Take 10 mg by mouth every 6 (six) hours as needed for muscle spasms.          . carbamazepine (TEGRETOL) 200 MG tablet   Oral   Take 1  tablet (200 mg total) by mouth 2 (two) times daily before a meal.   60 tablet   0   . diazepam (VALIUM) 2 MG tablet      0.5 tablets three times a day for two days; 0.5 tabs twice a day for two days; 0.5 tab daily for four days then stop   7 tablet   0   . gabapentin (NEURONTIN) 600 MG tablet      One tab twice a day         . oxybutynin (DITROPAN XL) 15 MG 24 hr tablet   Oral   Take 15 mg by mouth at bedtime.         . topiramate (TOPAMAX) 25 MG tablet   Oral   Take 1 tablet (25 mg total) by mouth 2 (two) times daily.   180  tablet   1   . butalbital-acetaminophen-caffeine (FIORICET, ESGIC) 50-325-40 MG tablet   Oral   Take 1 tablet by mouth daily as needed for headache. Patient not taking: Reported on 08/15/2015   30 tablet   1     Allergies Triamcinolone and Prednisone  Family History  Problem Relation Age of Onset  . Bipolar disorder Mother   . Hypertension Father     Social History Social History  Substance Use Topics  . Smoking status: Current Every Day Smoker -- 0.50 packs/day for 22 years  . Smokeless tobacco: None     Comment: smokes <1 pack of cigarettes per day, has been smoking for 22 years  . Alcohol Use: 0.0 oz/week    0 Standard drinks or equivalent per week     Comment: moderate alcohol use    Review of Systems Constitutional: No fever/chills Eyes: No visual changes. ENT: No sore throat. Cardiovascular: Denies chest pain. Respiratory: Denies shortness of breath. Gastrointestinal: No abdominal pain.  No nausea, no vomiting.  No diarrhea.  No constipation. Genitourinary: Negative for dysuria. Musculoskeletal: Negative for back pain. Skin: Negative for rash.   10-point ROS otherwise negative.  ____________________________________________   PHYSICAL EXAM:  VITAL SIGNS: ED Triage Vitals  Enc Vitals Group     BP 08/15/15 1924 135/87 mmHg     Pulse Rate 08/15/15 1924 78     Resp 08/15/15 1924 18     Temp 08/15/15 1924 97.8 F (36.6 C)     Temp Source 08/15/15 1924 Oral     SpO2 08/15/15 1924 97 %     Weight 08/15/15 1924 145 lb (65.772 kg)     Height 08/15/15 1924  (1.626 m)     Head Cir --      Peak Flow --      Pain Score 08/15/15 1924 7     Pain Loc --      Pain Edu? --      Excl. in GC? --    Constitutional: Alert and oriented. Well appearing and in no acute distress. Eyes: Conjunctivae are normal. PERRL. EOMI. funduscopic exam appears to be normal. Head: Atraumatic. Except for staples in place on the right side of the occiput and these appear to be  healing well. Nose: No congestion/rhinnorhea. Mouth/Throat: Mucous membranes are moist.  Oropharynx non-erythematous. Neck: No stridor.  Cardiovascular: Normal rate, regular rhythm. Grossly normal heart sounds.  Good peripheral circulation. Respiratory: Normal respiratory effort.  No retractions. Lungs CTAB. Gastrointestinal: Soft and nontender. No distention. No abdominal bruits. No CVA tenderness. Musculoskeletal: No lower extremity tenderness nor edema.  No joint effusions. Neurologic:  Normal speech and language. No new gross focal neurologic deficits are appreciated. Cranial nerves II through XII are intact cerebellar finger to nose and rapid alternating movements are normal motor strength is 5 over 5 throughout except for in the right leg patient is unable to move the right foot up or down she says the hip flexors and extensors appear to be normal in strength. There are no new changes on exam per the patient. Skin:  Skin is warm, dry and intact. No rash noted. Psychiatric: Mood and affect are normal. Speech and behavior are normal.  ____________________________________________   LABS (all labs ordered are listed, but only abnormal results are displayed)  Labs Reviewed  COMPREHENSIVE METABOLIC PANEL - Abnormal; Notable for the following:    Potassium 3.0 (*)    All other components within normal limits  CBC WITH DIFFERENTIAL/PLATELET - Abnormal; Notable for the following:    Platelets 135 (*)    Neutro Abs 6.8 (*)    All other components within normal limits  PREGNANCY, URINE  TROPONIN I  URINALYSIS COMPLETEWITH MICROSCOPIC ([facility] ONLY)   ____________________________________________  EKG   ____________________________________________  RADIOLOGY  ____________________________________________   PROCEDURES   ____________________________________________   INITIAL IMPRESSION / ASSESSMENT AND PLAN / ED COURSE  Pertinent labs & imaging results that were available during  my care of the patient were reviewed by me and considered in my medical decision making (see chart for details).  Dr. Manson Passey will follow-up patient ____________________________________________   FINAL CLINICAL IMPRESSION(S) / ED DIAGNOSES  Final diagnoses:  Headache, unspecified headache type      Arnaldo Natal, MD 08/16/15 0000

## 2015-08-15 NOTE — Telephone Encounter (Signed)
[facility] called and scheduled pt hospital f/u for 08/22/15 @ 11 am. Pt is being discharged today 08/15/15 and was treated for head injury due to fall. Thanks TNP

## 2015-08-15 NOTE — Care Management (Signed)
[facility] and spoke with Almeta Monashase in Careers information officermedical personnel. Patient has "been released from jail and the only property we have that she will have to sign out is her leg brace". I did confirm that the brace is at the jail. Patient states she does not have health insurance. [facility]. She has requested a rolling walker which has been requested from Will with [facility].

## 2015-08-15 NOTE — Progress Notes (Signed)
Physical Therapy Treatment Patient Details Name: Marilyn AlyFrances G Brown MRN: [id number] DOB: [birth year] Today's Date: 08/15/2015    History of Present Illness Pt is a 44 yo female who was [facility] to the hospital [facility] that resulted in her hitting her head. Pt with extensive confusion following fall.     PT Comments    Pt is self-limiting on this session due to sadness over discharge plans to homeless shelter. She agrees to perform one lap of ambulation to door and back to bed but refuses further ambulation. Pt instructed in sciatic nerve flossing exercise at EOB to decrease sciatic nerve pain but she won't continue due to increased pain with passive R ankle dorsiflexion. Pt is steady and stable ambulating with walker. She refuses to put weight through RLE at this time due to pain but is able to ambulate safely and appropriately with rolling walker. Pt will benefit from skilled PT services while at [facility] to address deficits in strength, balance, and mobility in order to return to full function at home. No PT needs at discharge but pt will need a rolling walker and AFO brace from home. Encouraged pt to follow-up with regular MD regarding RLE sciatic nerve pain which he is already managing.    Follow Up Recommendations  No PT follow up (Follow up with MD regarding sciatic pain with R ankle DF)     Equipment Recommendations  Rolling walker with 5" wheels (Will need rolling walker prior to discharge. Needs AFO)    Recommendations for Other Services       Precautions / Restrictions Precautions Precautions: Fall Restrictions Weight Bearing Restrictions: No    Mobility  Bed Mobility Overal bed mobility: Modified Independent             General bed mobility comments: Pt does not need bed rails to come to sitting on this date. Good speed and sequencing  Transfers Overall transfer level: Modified independent Equipment used: Rolling walker (2 wheeled)              General transfer comment: Pt with good strength and sequencing with sit to stand. Minimal weight shift to RLE due to pain  Ambulation/Gait Ambulation/Gait assistance: Supervision Ambulation Distance (Feet): 35 Feet Assistive device: Rolling walker (2 wheeled)   Gait velocity: Decreased Gait velocity interpretation: <1.8 ft/sec, indicative of risk for recurrent falls General Gait Details: Pt with unwilling to bear weight through RLE due to increased sciatic nerve pain with R ankle dorsiflexion. She is willing to attempt and takes a few steps but reports that pain is too significant to continue with WB. Pt performs ambulation with hop-to gait with rolling walker and NWB on RLE. Pt states that sciatic pain in RLE has worsened due to prolonged bed rest in hosptial. Pt does have a history of R foot drop which is corrected with an AFO however pt does not have brace available at this time. Pt demonstrates good stability and LLE strength as well as good sequencing with rolling walker. No overt LOB with rolling walker. Pt refuses to ambulate in hallway or ambulate further at this time due to feeling sad.   Stairs            Wheelchair Mobility    Modified Rankin (Stroke Patients Only)       Balance Overall balance assessment:  (Unable to assess due to pain with RLE weight bearing)  Cognition Arousal/Alertness: Awake/alert Behavior During Therapy: Flat affect (tearful) Overall Cognitive Status: No family/caregiver present to determine baseline cognitive functioning (Confusion improved.)       Memory: Decreased short-term memory (Unable to recall details of fall, length of time in jail)              Exercises Other Exercises Other Exercises: Pt instructed in RLE sciatic nerve flossing using blanket for self mobilization. Perfomred x 3 with patient but she stops due to increased pain    General Comments        Pertinent  Vitals/Pain Pain Assessment: No/denies pain    Home Living                      Prior Function            PT Goals (current goals can now be found in the care plan section) Acute Rehab PT Goals Patient Stated Goal: "I want to walk" PT Goal Formulation: With patient Time For Goal Achievement: 08/28/15 Potential to Achieve Goals: Fair Progress towards PT goals: Progressing toward goals    Frequency  Min 2X/week    PT Plan Current plan remains appropriate    Co-evaluation             End of Session Equipment Utilized During Treatment: Gait belt Activity Tolerance: Patient limited by pain Patient left: in bed;with call bell/phone within reach;with bed alarm set     Time: 1610-9604 PT Time Calculation (min) (ACUTE ONLY): 12 min  Charges:  $Therapeutic Activity: 8-22 mins                    G Codes:      Sharalyn Ink Margan Elias PT, DPT   Navayah Sok 08/15/2015, 10:48 AM

## 2015-08-15 NOTE — Progress Notes (Signed)
Clinical Child psychotherapistocial Worker (CSW) gave patient [place] resources including [facility], [facility] and [facility]. CSW also gave patient homeless shelter information in [place]. Marilyn Brown clinical social work Merchandiser, retailsupervisor [place] [facility] so [facility] can get her brace and then to [facility]. CSW called [facility] numerous times throughout the day and left multiple messages. Thadius from [facility] called CSW back after 4 pm and left a message with no detailes about bed availability. CSW called Thadius back right away and did not get an answer. CSW encouraged patient to call [address] or [facility] in [place] if [facility] does not have a bed. Per patient's mother Marilyn Brown patient's husband is dropping off patient's cell phone and clothes at [facility]. Patient continued to reported that she has nowhere else to go. Patient's mother Marilyn Brown reported that she could not live with her but she would assist patient with money and food.   Marilyn Brown, LCSWA [phone]

## 2015-08-15 NOTE — ED Notes (Signed)
EDP at bedside  

## 2015-08-15 NOTE — ED Notes (Addendum)
CT notified pt ready ?

## 2015-08-15 NOTE — Consult Note (Signed)
[facility] Consult   Reason for Consult:  Follow-up for this 44 year old woman who was admitted to the hospital delirious with what was thought to probably be related to benzodiazepine withdrawal Referring Physician:  Earleen Newport Patient Identification: KARLYN GLASCO MRN:  [id number] Principal Diagnosis: Delirium Diagnosis:   Patient Active Problem List   Diagnosis Date Noted  . Delirium [R41.0] 08/11/2015  . Altered mental status [R41.82] 08/11/2015  . Depression [F32.9] 08/04/2015  . Accumulation of fluid in tissues [R60.9] 05/04/2015  . Bulge of lumbar disc without myelopathy [M51.26] 05/04/2015  . Contusion [T14.8] 05/04/2015  . Neuropathy ([place]) [G62.9] 05/04/2015  . Neuralgia neuritis, sciatic nerve [M54.30] 05/04/2015  . Anxiety [F41.9] 04/11/2015  . BP (high blood pressure) [I10] 04/11/2015  . Lumbar radiculopathy [M54.16] 09/26/2014  . Deliberate medication overdose ([place]) [T50.902A] 09/15/2014  . Polypharmacy [Z79.899] 05/27/2014  . Displacement of lumbar intervertebral disc without myelopathy [M51.26] 11/26/2013  . Deflected nasal septum [J34.2] 06/29/2013  . Cephalalgia [R51] 06/29/2013  . Allergic rhinitis [J30.9] 06/29/2013  . Nasal obstruction [J34.89] 06/29/2013  . 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 FOM (frequency of micturition) [R35.0] 04/10/2012  . Cervical pain [M54.2] 01/17/2012  . Thoracic and lumbosacral neuritis [M54.14, M54.17] 12/27/2011  . Abnormal toxicological findings [R89.2] 04/25/2011  . Excess, menstruation [N92.0] 02/08/2011  . Disorder of sacrum [M53.3] 12/05/2010  . Menopausal symptom [N95.1] 05/01/2010  . 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 Hemorrhage of gastrointestinal tract [K92.2] 03/03/2009  . Absolute anemia [D64.9] 03/01/2009  . Fatty metamorphosis of liver [K76.0] 02/27/2009  . Disorder of teeth and supporting structures [K08.9] 08/25/2007  . Essential (primary)  hypertension [I10] 10/20/2006  . Insomnia [G47.00] 09/30/2002  . Anxiety disorder [F41.9] 09/30/1998  . 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 female patient admitted with "I'm still feeling a little uncertain".  HPI:  Patient reevaluated today. Also discussed the case with the hospitalist. Chart reviewed. This 44 year old woman came into the hospital delirious from jail. History supportive likely withdrawal from benzodiazepines. She has been treated with Tegretol and Valium. She has regained lucidity and is no longer delirious. Patient denies any suicidality. Does not appear to be psychotic. Today the charges against her were dropped by the jail and she no longer has to go back to the jail when she leaves here. On the other hand apparently there is a restraining order against her by her husband and she is not able to go home. Patient is working with social work on discharge planning. Patient denies any suicidal ideation reports her mood mainly as anxious. Not hallucinating not confused currently  Past Psychiatric History: Review of depression and anxiety and a history of misuse of prescription drugs  Risk to Self: Is patient at risk for suicide?: No Risk to Others:   Prior Inpatient Therapy:   Prior Outpatient Therapy:    Past Medical History:  Past Medical History  Diagnosis Date  . Anemia   . Anxiety   . Right leg weakness   . Hypertension   . Depression    History reviewed. No pertinent past surgical history. Family History:  Family History  Problem Relation Age of Onset  . Bipolar disorder Mother   . Hypertension Father    Family Psychiatric  History: Positive for bipolar disorder in her mother Social History:  History  Alcohol Use  . 0.0 oz/week  .  0 Standard drinks or equivalent per week    Comment: moderate alcohol use     History  Drug Use No    Social History   Social  History  . Marital Status: Married    Spouse Name: N/A  . Number of Children: 2  . Years of Education: N/A   Occupational History  . Disabled     not   Social History Main Topics  . Smoking status: Current Every Day Smoker -- 0.50 packs/day for 22 years  . Smokeless tobacco: None     Comment: smokes <1 pack of cigarettes per day, has been smoking for 22 years  . Alcohol Use: 0.0 oz/week    0 Standard drinks or equivalent per week     Comment: moderate alcohol use  . Drug Use: No  . Sexual Activity: Not Asked   Other Topics Concern  . None   Social History Narrative   Additional Social History:                          Allergies:   Allergies  Allergen Reactions  . Triamcinolone Swelling  . Prednisone Anxiety    Patient had swelling     Labs:  Results for orders placed or performed during the hospital encounter of 08/11/15 (from the past 48 hour(s))  Urinalysis complete, with microscopic ([facility] only)     Status: Abnormal   Collection Time: 08/13/15 10:00 PM  Result Value Ref Range   Color, Urine YELLOW (A) YELLOW   APPearance CLEAR (A) CLEAR   Glucose, UA NEGATIVE NEGATIVE mg/dL   Bilirubin Urine NEGATIVE NEGATIVE   Ketones, ur TRACE (A) NEGATIVE mg/dL   Specific Gravity, Urine 1.008 1.005 - 1.030   Hgb urine dipstick 2+ (A) NEGATIVE   pH 7.0 5.0 - 8.0   Protein, ur NEGATIVE NEGATIVE mg/dL   Nitrite NEGATIVE NEGATIVE   Leukocytes, UA NEGATIVE NEGATIVE   RBC / HPF 0-5 0 - 5 RBC/hpf   WBC, UA 0-5 0 - 5 WBC/hpf   Bacteria, UA NONE SEEN NONE SEEN   Squamous Epithelial / LPF 0-5 (A) NONE SEEN   Mucous PRESENT   Urine culture     Status: None   Collection Time: 08/13/15 10:00 PM  Result Value Ref Range   Specimen Description URINE, CLEAN CATCH    Special Requests NONE    Culture MULTIPLE SPECIES PRESENT, SUGGEST RECOLLECTION    Report Status 08/15/2015 FINAL   Basic metabolic panel     Status: Abnormal   Collection Time: 08/14/15  4:47 AM  Result  Value Ref Range   Sodium 137 135 - 145 mmol/L   Potassium 3.7 3.5 - 5.1 mmol/L   Chloride 110 101 - 111 mmol/L   CO2 22 22 - 32 mmol/L   Glucose, Bld 112 (H) 65 - 99 mg/dL   BUN 12 6 - 20 mg/dL   Creatinine, Ser 0.60 0.44 - 1.00 mg/dL   Calcium 8.5 (L) 8.9 - 10.3 mg/dL   GFR calc non Af Amer >60 >60 mL/min   GFR calc Af Amer >60 >60 mL/min    Comment: (NOTE) The eGFR has been calculated using the CKD EPI equation. This calculation has not been validated in all clinical situations. eGFR's persistently <60 mL/min signify possible Chronic Kidney Disease.    Anion gap 5 5 - 15  Magnesium     Status: None   Collection Time: 08/14/15  4:47 AM  Result Value  Ref Range   Magnesium 2.0 1.7 - 2.4 mg/dL  CBC     Status: Abnormal   Collection Time: 08/14/15  4:47 AM  Result Value Ref Range   WBC 9.7 3.6 - 11.0 K/uL   RBC 4.28 3.80 - 5.20 MIL/uL   Hemoglobin 14.9 12.0 - 16.0 g/dL   HCT 43.1 35.0 - 47.0 %   MCV 100.8 (H) 80.0 - 100.0 fL   MCH 34.7 (H) 26.0 - 34.0 pg   MCHC 34.5 32.0 - 36.0 g/dL   RDW 12.8 11.5 - 14.5 %   Platelets 130 (L) 150 - 440 K/uL    Current Facility-Administered Medications  Medication Dose Route Frequency Provider Last Rate Last Dose  . acetaminophen (TYLENOL) tablet 650 mg  650 mg Oral Q6H PRN Henreitta Leber, MD   650 mg at 08/15/15 1419   Or  . acetaminophen (TYLENOL) suppository 650 mg  650 mg Rectal Q6H PRN Henreitta Leber, MD      . amLODipine (NORVASC) tablet 5 mg  5 mg Oral Daily Henreitta Leber, MD   5 mg at 08/15/15 0926  . amoxicillin-clavulanate (AUGMENTIN) 875-125 MG per tablet 1 tablet  1 tablet Oral Q12H Loletha Grayer, MD   1 tablet at 08/15/15 [phone]  . atenolol (TENORMIN) tablet 50 mg  50 mg Oral BID Henreitta Leber, MD   50 mg at 08/15/15 1191  . baclofen (LIORESAL) tablet 10 mg  10 mg Oral Q6H PRN Henreitta Leber, MD      . carbamazepine (TEGRETOL) tablet 200 mg  200 mg Oral BID AC Gonzella Lex, MD   200 mg at 08/15/15 1601  . diazepam  (VALIUM) tablet 1 mg  1 mg Oral TID Gonzella Lex, MD   1 mg at 08/15/15 1601  . gabapentin (NEURONTIN) capsule 600 mg  600 mg Oral BID Loletha Grayer, MD   600 mg at 08/15/15 1419  . gabapentin (NEURONTIN) capsule 900 mg  900 mg Oral QHS Loletha Grayer, MD   900 mg at 08/14/15 2127  . ondansetron (ZOFRAN) tablet 4 mg  4 mg Oral Q6H PRN Henreitta Leber, MD       Or  . ondansetron (ZOFRAN) injection 4 mg  4 mg Intravenous Q6H PRN Henreitta Leber, MD      . oxybutynin (DITROPAN-XL) 24 hr tablet 15 mg  15 mg Oral QHS Henreitta Leber, MD   15 mg at 08/14/15 2127  . topiramate (TOPAMAX) tablet 25 mg  25 mg Oral BID Henreitta Leber, MD   25 mg at 08/15/15 4782    Musculoskeletal: Strength & Muscle Tone: decreased Gait & Station: unsteady Patient leans: N/A  Psychiatric Specialty Exam: Review of Systems  HENT: Negative.   Eyes: Negative.   Respiratory: Negative.   Cardiovascular: Negative.   Gastrointestinal: Negative.   Musculoskeletal: Negative.   Skin: Negative.   Neurological: Positive for weakness.  Psychiatric/Behavioral: Positive for memory loss. Negative for depression, suicidal ideas, hallucinations and substance abuse. The patient is nervous/anxious. The patient does not have insomnia.     Blood pressure 139/62, pulse 72, temperature 98.7 F (37.1 C), temperature source Oral, resp. rate 18, height _0  (1.626 m), weight 58.968 kg (130 lb), SpO2 99 %.Body mass index is 22.3 kg/(m^2).  General Appearance: Casual  Eye Contact::  Fair  Speech:  Slow  Volume:  Decreased  Mood:  Dysphoric  Affect:  Constricted  Thought Process:  Linear  Orientation:  Full (  Time, Place, and Person)  Thought Content:  Negative  Suicidal Thoughts:  No  Homicidal Thoughts:  No  Memory:  Immediate;   Fair Recent;   Fair Remote;   Fair  Judgement:  Intact  Insight:  Fair  Psychomotor Activity:  Decreased  Concentration:  Fair  Recall:  [employer] of Knowledge:Fair  Language: Fair   Akathisia:  No  Handed:  Right  AIMS (if indicated):     Assets:  Desire for Improvement Physical Health Resilience Social Support  ADL's:  Intact  Cognition: WNL  Sleep:      Treatment Plan Summary: Medication management and Plan Reviewed plan with the patient and with the hospitalist. Apparently she is to be discharged and might be going to the shelter. I suggested to Dr. Earleen Newport that a taper of Valium over about a week should be reasonably adequate at this point. I would continue the carbamazepine but I think that only needs to go for probably another 2 weeks. I told patient she could probably discontinue it at that point. Meanwhile she needs to follow up with an outpatient provider for mental health treatment and follow-up of anxiety. Patient is familiar with [facility] and so back to follow up there. Strongly again counseled her against overuse of sedatives. Patient no longer meets commitment criteria. She can be released from the hospital.  Disposition: Patient does not meet criteria for psychiatric inpatient admission. Supportive therapy provided about ongoing stressors. Discussed crisis plan, support from social network, calling 911, coming to the Emergency Department, and calling Suicide Hotline.  Saadiq Poche 08/15/2015 4:05 PM

## 2015-08-15 NOTE — ED Notes (Signed)
Pt to triage via w/c with no distress noted, [place] [facility]; reports d/c from hospital today to the homeless shelter and "I think I was discharged too early"; st shelter would not take her in because "I have staples in my head"; pt c/o persistent frontal HA

## 2015-08-15 NOTE — Discharge Instructions (Signed)
Needs follow up one week to remove staples from back of head
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
## 2015-08-15 NOTE — ED Notes (Signed)
Patient gave this nurse to c [phone]

## 2015-08-15 NOTE — Discharge Summary (Signed)
[facility] [place] [facility]   PATIENT NAME: Marilyn Brown    MR#:  [id number]  DATE OF BIRTH:  1971/05/01  DATE OF ADMISSION:  08/11/2015 ADMITTING PHYSICIAN: Houston SirenVivek J Sainani, MD  DATE OF DISCHARGE: 08/15/2015  PRIMARY CARE PHYSICIAN: Mila Merryonald Fisher, MD    ADMISSION DIAGNOSIS:  Delirium [R41.0] Laceration of head, initial encounter [S01.91XA]  DISCHARGE DIAGNOSIS:  Principal Problem:   Delirium Active Problems:   Altered mental status   SECONDARY DIAGNOSIS:   Past Medical History  Diagnosis Date  . Anemia   . Anxiety   . Right leg weakness   . Hypertension   . Depression     HOSPITAL COURSE:   1. Acute encephalopathy and delirium. This is thought to be secondary to benzodiazepine withdrawal. Patient took a few days to improve by restarting the patient on benzodiazepine Valium. I will give a tapering dose of Valium to off over the next week. 2. Acute otitis media- patient was started on IV Unasyn and switched over to by mouth Augmentin. 3. Abdominal pain- CT scan of the abdomen was negative. 4. Chronic pain and depression- follows with pain management as outpatient 5. Essential hypertension continue Norvasc and atenolol 6. Hypokalemia replaced during the hospital course 7. Fall and laceration of the scalp- need staple removal and follow-up appointment one week with Dr. Sherrie MustacheFisher.  DISCHARGE CONDITIONS:   Follow-up one week Dr. Sherrie MustacheFisher  CONSULTS OBTAINED:  Treatment Team:  Audery AmelJohn T Clapacs, MD Pauletta BrownsYuriy Zeylikman, MD  DRUG ALLERGIES:   Allergies  Allergen Reactions  . Triamcinolone Swelling  . Prednisone Anxiety    Patient had swelling     DISCHARGE MEDICATIONS:   Current Discharge Medication List    START taking these medications   Details  amoxicillin-clavulanate (AUGMENTIN) 875-125 MG tablet Take 1 tablet by mouth every 12 (twelve) hours. Qty: 18 tablet, Refills: 0    carbamazepine (TEGRETOL) 200 MG tablet Take 1  tablet (200 mg total) by mouth 2 (two) times daily before a meal. Qty: 60 tablet, Refills: 0    diazepam (VALIUM) 2 MG tablet 0.5 tablets three times a day for two days; 0.5 tabs twice a day for two days; 0.5 tab daily for four days then stop Qty: 7 tablet, Refills: 0      CONTINUE these medications which have CHANGED   Details  gabapentin (NEURONTIN) 600 MG tablet One tab twice a day   Associated Diagnoses: Neuropathy (HCC)      CONTINUE these medications which have NOT CHANGED   Details  amLODipine (NORVASC) 5 MG tablet Take 1 tablet (5 mg total) by mouth daily. Qty: 30 tablet, Refills: 1   Associated Diagnoses: Essential hypertension    atenolol (TENORMIN) 50 MG tablet Take 1 tablet (50 mg total) by mouth 2 (two) times daily. Qty: 180 tablet, Refills: 1    baclofen (LIORESAL) 10 MG tablet Take 10 mg by mouth every 6 (six) hours as needed for muscle spasms.     butalbital-acetaminophen-caffeine (FIORICET, ESGIC) 50-325-40 MG tablet Take 1 tablet by mouth daily as needed for headache. Qty: 30 tablet, Refills: 1    oxybutynin (DITROPAN XL) 15 MG 24 hr tablet Take 15 mg by mouth at bedtime.    topiramate (TOPAMAX) 25 MG tablet Take 1 tablet (25 mg total) by mouth 2 (two) times daily. Qty: 180 tablet, Refills: 1      STOP taking these medications     alprazolam (XANAX) 2 MG tablet  oxyCODONE (OXY IR/ROXICODONE) 5 MG immediate release tablet      sertraline (ZOLOFT) 50 MG tablet          DISCHARGE INSTRUCTIONS:   Follow-up 1 week Dr. Sherrie Mustache  If you experience worsening of your admission symptoms, develop shortness of breath, life threatening emergency, suicidal or homicidal thoughts you must seek medical attention immediately by calling 911 or calling your MD immediately  if symptoms less severe.  You Must read complete instructions/literature along with all the possible adverse reactions/side effects for all the Medicines you take and that have been prescribed to  you. Take any new Medicines after you have completely understood and accept all the possible adverse reactions/side effects.   Please note  You were cared for by a hospitalist during your hospital stay. If you have any questions about your discharge medications or the care you received while you were in the hospital after you are discharged, you can call the unit and asked to speak with the hospitalist on call if the hospitalist that took care of you is not available. Once you are discharged, your primary care physician will handle any further medical issues. Please note that NO REFILLS for any discharge medications will be authorized once you are discharged, as it is imperative that you return to your primary care physician (or establish a relationship with a primary care physician if you do not have one) for your aftercare needs so that they can reassess your need for medications and monitor your lab values.    Today   CHIEF COMPLAINT:   Chief Complaint  Patient presents with  . Fall    HISTORY OF PRESENT ILLNESS:  Marilyn Brown  is a 44 y.o. female with a known history of chronic pain presented with a fall from jail. She also had altered mental status thought to be benzodiazepine withdrawal   VITAL SIGNS:  Blood pressure 139/62, pulse 72, temperature 98.7 F (37.1 C), temperature source Oral, resp. rate 18, height  (1.626 m), weight 58.968 kg (130 lb), SpO2 99 %.  I/O:   Intake/Output Summary (Last 24 hours) at 08/15/15 1003 Last data filed at 08/14/15 2138  Gross per 24 hour  Intake    280 ml  Output   2450 ml  Net  -2170 ml    PHYSICAL EXAMINATION:  GENERAL:  44 y.o.-year-old patient lying in the bed with no acute distress.  EYES: Pupils equal, round, reactive to light and accommodation. No scleral icterus. Extraocular muscles intact.  HEENT: Head atraumatic, normocephalic. Oropharynx and nasopharynx clear.  NECK:  Supple, no jugular venous distention. No thyroid  enlargement, no tenderness.  LUNGS: Normal breath sounds bilaterally, no wheezing, rales,rhonchi or crepitation. No use of accessory muscles of respiration.  CARDIOVASCULAR: S1, S2 normal. No murmurs, rubs, or gallops.  ABDOMEN: Soft, non-tender, non-distended. Bowel sounds present. No organomegaly or mass.  EXTREMITIES: No pedal edema, cyanosis, or clubbing.  NEUROLOGIC: Cranial nerves II through XII are intact. Muscle strength 5/5 in all extremities. Sensation intact. Gait not checked.  PSYCHIATRIC: The patient is alert and oriented.  SKIN: No obvious rash, lesion, or ulcer.   DATA REVIEW:   CBC  Recent Labs Lab 08/14/15 0447  WBC 9.7  HGB 14.9  HCT 43.1  PLT 130*    Chemistries   Recent Labs Lab 08/11/15 1307  08/14/15 0447  NA 138  < > 137  K 3.2*  < > 3.7  CL 104  < > 110  CO2 24  < >  22  GLUCOSE 89  < > 112*  BUN 33*  < > 12  CREATININE 0.72  < > 0.60  CALCIUM 9.5  < > 8.5*  MG  --   < > 2.0  AST 26  --   --   ALT 31  --   --   ALKPHOS 61  --   --   BILITOT 2.1*  --   --   < > = values in this interval not displayed.  Cardiac Enzymes  Recent Labs Lab 08/11/15 1307  TROPONINI <0.03    Microbiology Results  Results for orders placed or performed during the hospital encounter of 08/11/15  MRSA PCR Screening     Status: None   Collection Time: 08/12/15  4:00 AM  Result Value Ref Range Status   MRSA by PCR NEGATIVE NEGATIVE Final    Comment:        The GeneXpert MRSA Assay (FDA approved for NASAL specimens only), is one component of a comprehensive MRSA colonization surveillance program. It is not intended to diagnose MRSA infection nor to guide or monitor treatment for MRSA infections.   Urine culture     Status: None (Preliminary result)   Collection Time: 08/13/15 10:00 PM  Result Value Ref Range Status   Specimen Description URINE, CLEAN CATCH  Final   Special Requests NONE  Final   Culture TOO YOUNG TO READ  Final   Report Status PENDING   Incomplete    RADIOLOGY:  Dg Chest 1 View  08/13/2015  CLINICAL DATA:  Cough, hypertension, smoking history. EXAM: CHEST 1 VIEW COMPARISON:  None. FINDINGS: The heart size and mediastinal contours are within normal limits. Both lungs are clear. The visualized skeletal structures are unremarkable. IMPRESSION: No active disease. Electronically Signed   By: Bary Gabi Mcfate M.D.   On: 08/13/2015 12:57   Ct Abdomen Pelvis W Contrast  08/14/2015  CLINICAL DATA:  Generalized abdominal pain since this morning, diarrhea since yesterday, hypertension, difficulty walking EXAM: CT ABDOMEN AND PELVIS WITH CONTRAST TECHNIQUE: Multidetector CT imaging of the abdomen and pelvis was performed using the standard protocol following bolus administration of intravenous contrast. Sagittal and coronal MPR images reconstructed from axial data set. CONTRAST:  OMNIPAQUE IOHEXOL 300 MG/ML SOLN IV. Dilute oral contrast. COMPARISON:  02/27/2009 FINDINGS: Minimal dependent atelectasis RIGHT lower lobe. Probable tiny LEFT renal cysts. Tiny nonobstructing RIGHT renal calculus image 20. Liver, gallbladder, spleen, pancreas, kidneys, and adrenal glands otherwise normal. Normal appendix, bladder, ureters, and adnexa. IUD within uterus. Stomach and bowel loops normal appearance. No mass, adenopathy, free air, free fluid, or inflammatory process. Osseous structures unremarkable. IMPRESSION: No acute intra-abdominal or intrapelvic abnormalities. Electronically Signed   By: Ulyses Southward M.D.   On: 08/14/2015 14:18    Management plans discussed with the patient, social work and care management team and they are in agreement.  CODE STATUS:     Code Status Orders        Start     Ordered   08/11/15 1756  Full code   Continuous     08/11/15 1756      TOTAL TIME TAKING CARE OF THIS PATIENT: 35 minutes.    Alford Highland M.D on 08/15/2015 at 10:03 AM  Between 7am to 6pm - Pager - [phone]  After 6pm go to  [email or web address] - password [email or web address] [facility]  [facility]  Office  [phone]  CC: Primary care physician; Mila Merry, MD

## 2015-08-16 NOTE — ED Notes (Signed)
D/c inst to pt.  Iv d'ced.  Mother with pt

## 2015-08-16 NOTE — Discharge Instructions (Signed)

## 2015-08-16 NOTE — ED Provider Notes (Signed)
I assumed care of the patient 11:00 PM from Dr. Juliette AlcideMelinda. Chest x-ray and CT scan of the head revealed: DG Chest Portable 1 View (Final result) Result time: 08/15/15 23:29:41   Final result by Rad Results In Interface (08/15/15 23:29:41)   Narrative:   CLINICAL DATA: Weakness, recent head injury  EXAM: PORTABLE CHEST - 1 VIEW  COMPARISON: 08/13/2015  FINDINGS: Lungs are clear. Heart size and mediastinal contours are within normal limits. No effusion. Visualized skeletal structures are unremarkable.  IMPRESSION: No acute cardiopulmonary disease.   Electronically Signed By: Corlis Leak Hassell M.D. On: 08/15/2015 23:29          CT Head Wo Contrast (Final result) Result time: 08/15/15 23:13:49   Final result by Rad Results In Interface (08/15/15 23:13:49)   Narrative:   CLINICAL DATA: Pt to triage via w/c with no distress noted, [facility]; reports [facility] and "I think I was discharged too early"; st shelter would not take her in because "I have .*comment was truncated*  EXAM: CT HEAD WITHOUT CONTRAST  TECHNIQUE: Contiguous axial images were obtained from the base of the skull through the vertex without intravenous contrast.  COMPARISON: 08/13/2015  FINDINGS: Right parietal scalp skin staples. There is no evidence of acute intracranial hemorrhage, brain edema, mass lesion, acute infarction, mass effect, or midline shift. Acute infarct may be inapparent on noncontrast CT. No other intra-axial abnormalities are seen, and the ventricles and sulci are within normal limits in size and symmetry. No abnormal extra-axial fluid collections or masses are identified. No significant calvarial abnormality.  IMPRESSION: 1. Negative for bleed or other acute intracranial process.   Electronically Signed By: Corlis Leak Hassell M.D. On: 08/15/2015 23:13      Patient referred to PMD for further outpatient  evaluation and management.  Darci Currentandolph N Tarrell Debes, MD 08/16/15 [phone]

## 2015-08-16 NOTE — ED Notes (Addendum)
Pt Mother is outside in waiting room, pt asked and pt allows her mother to come back

## 2015-08-17 ENCOUNTER — Telehealth: Payer: Self-pay | Admitting: Family Medicine
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
## 2015-08-18 ENCOUNTER — Other Ambulatory Visit: Payer: Self-pay | Admitting: Family Medicine

## 2015-08-18 NOTE — Telephone Encounter (Signed)
Please review. Thanks!  

## 2015-08-18 NOTE — Telephone Encounter (Signed)
Pt returned your call.  

## 2015-08-18 NOTE — Telephone Encounter (Signed)
Please call in alprazolam.  

## 2015-08-18 NOTE — Telephone Encounter (Signed)
Done. Prescription called into pharmacy.  

## 2015-08-21 NOTE — Telephone Encounter (Signed)
Patient notified. Patient stated that she will call back tomorrow and set-up an appt to have the staples removed.

## 2015-08-21 NOTE — Telephone Encounter (Signed)
Staples need to be removed 7-10 days after they were placed.

## 2015-08-21 NOTE — Telephone Encounter (Signed)
Patient called office to cancel her f/u appt for 08/22/2015. Patient was suppose to come in to have staples removed from her head. After pt left hospital she moved in with her mother who lives in another town. Patient stated that she is unable to come to appt tomorrow. Patient wanted to know when she should have staples removed? Patient also stated that she did not receive a rx for sertraline.

## 2015-08-22 ENCOUNTER — Inpatient Hospital Stay: Payer: Self-pay | Admitting: Family Medicine

## 2015-08-30 ENCOUNTER — Other Ambulatory Visit: Payer: Self-pay | Admitting: Family Medicine

## 2015-08-30 DIAGNOSIS — I10 Essential (primary) hypertension: Secondary | ICD-10-CM

## 2015-08-30 NOTE — Telephone Encounter (Signed)
Pt contacted office for refill request on the following medications:  [facility] [address]  [place]./phone #[phone].  CB#[phone]/MW  Pt states she is not at home and not able to see if she has refills.  [place].  atenolol (TENORMIN) 50 MG tablet  amLODipine (NORVASC) 5 MG tablet

## 2015-08-31 MED ORDER — ATENOLOL 50 MG PO TABS: 50.0000 mg | ORAL_TABLET | Freq: Two times a day (BID) | ORAL | Status: DC

## 2015-08-31 MED ORDER — AMLODIPINE BESYLATE 5 MG PO TABS: 5.0000 mg | ORAL_TABLET | Freq: Every day | ORAL | Status: DC

## 2015-09-07 ENCOUNTER — Telehealth: Payer: Self-pay | Admitting: Family Medicine

## 2015-10-24 ENCOUNTER — Other Ambulatory Visit: Payer: Self-pay | Admitting: Family Medicine

## 2015-10-24 DIAGNOSIS — F411 Generalized anxiety disorder: Secondary | ICD-10-CM

## 2015-10-25 NOTE — Telephone Encounter (Signed)
Please call in alprazolam.  

## 2015-10-31 DIAGNOSIS — G43109 Migraine with aura, not intractable, without status migrainosus: Secondary | ICD-10-CM | POA: Insufficient documentation

## 2015-10-31 DIAGNOSIS — I7300 Raynaud's syndrome without gangrene: Secondary | ICD-10-CM | POA: Insufficient documentation

## 2015-10-31 DIAGNOSIS — N3281 Overactive bladder: Secondary | ICD-10-CM | POA: Insufficient documentation

## 2015-11-30 DIAGNOSIS — M1990 Unspecified osteoarthritis, unspecified site: Secondary | ICD-10-CM | POA: Insufficient documentation

## 2015-11-30 DIAGNOSIS — E781 Pure hyperglyceridemia: Secondary | ICD-10-CM | POA: Insufficient documentation

## 2015-12-31 DIAGNOSIS — G8921 Chronic pain due to trauma: Secondary | ICD-10-CM | POA: Insufficient documentation

## 2016-04-04 IMAGING — CT CT HEAD W/O CM
2 series · 14 of 30 positions shown, 16 images · non-contrast
Comparison: 08/11/2015

CLINICAL DATA: 44-year-old female with acute confusion following
fall.

EXAM:
CT HEAD WITHOUT CONTRAST
TECHNIQUE: Contiguous axial images were obtained from the base of the skull
through the vertex without intravenous contrast.

[Series 2: head bone · axial · 0.43mm/px · z∈[+18,+168]mm · 8 of 93 slices shown]
[im 9/93  bone]
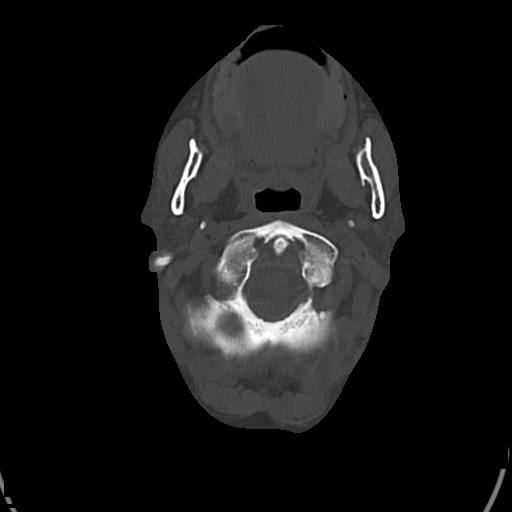
[im 18/93  bone]
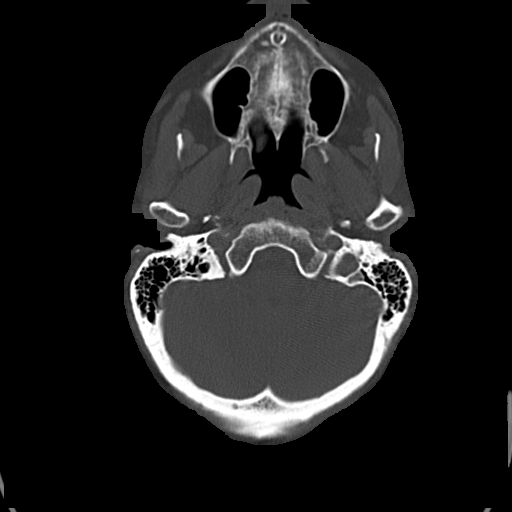
[im 31/93  bone]
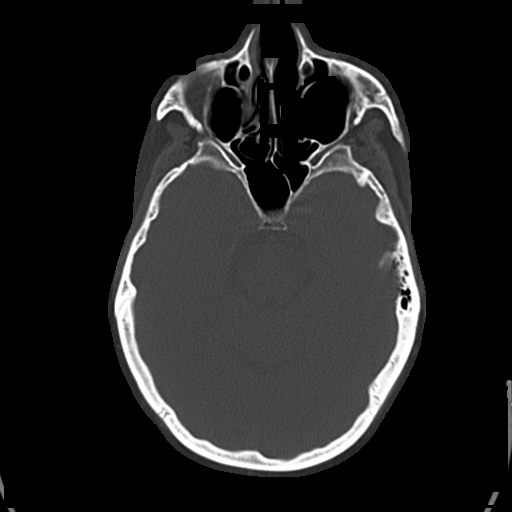
[im 40/93  bone]
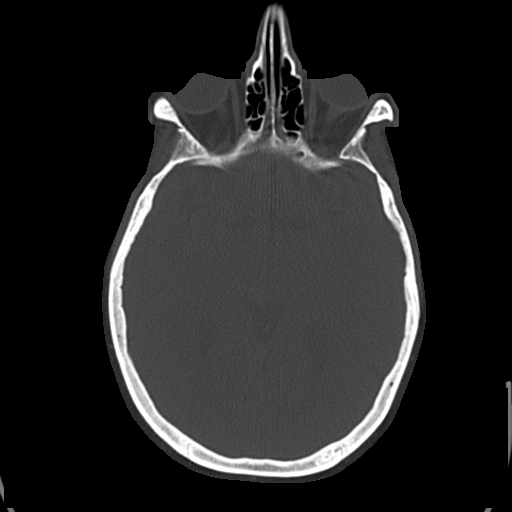
[im 53/93  bone]
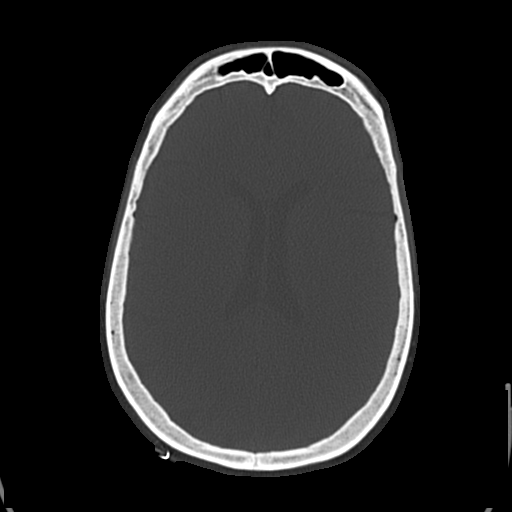
[im 62/93  bone]
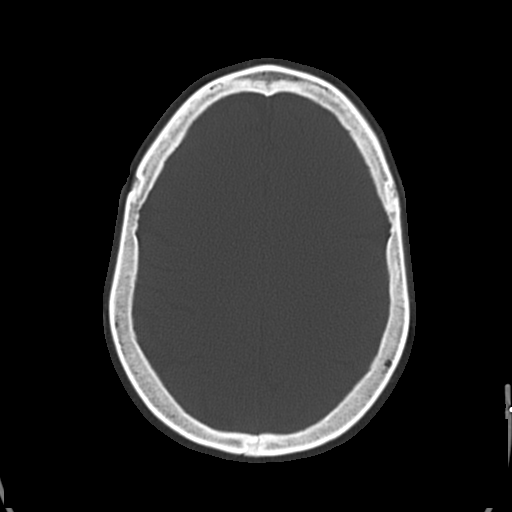
[im 75/93  bone]
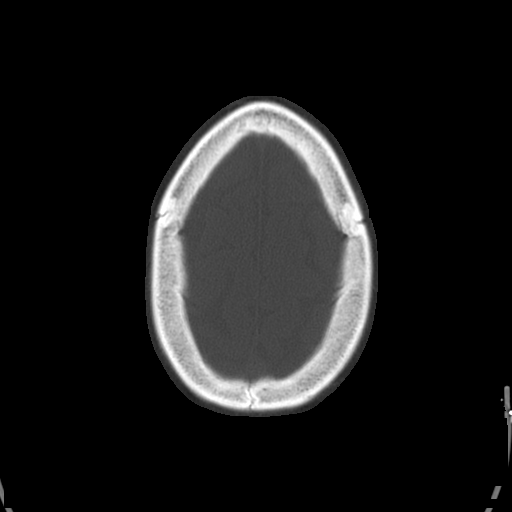
[im 84/93  bone]
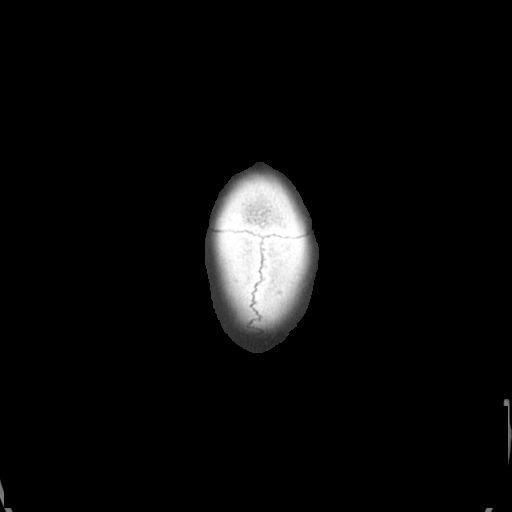

[Series 3: head wo · axial · 0.43mm/px · z∈[+32,+152]mm · 6 of 34 slices shown, 8 images]
[im 5/34  brain]
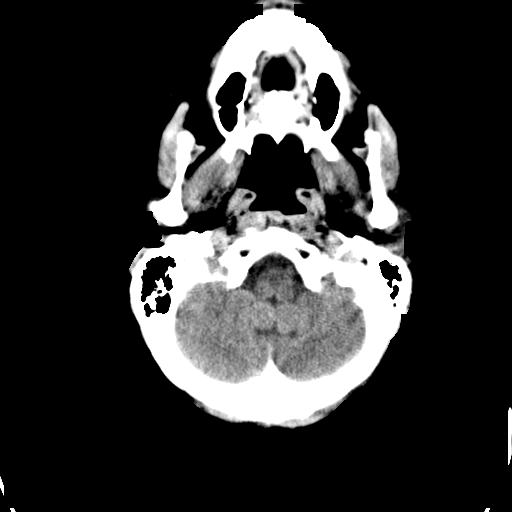
[im 5/34  bone]
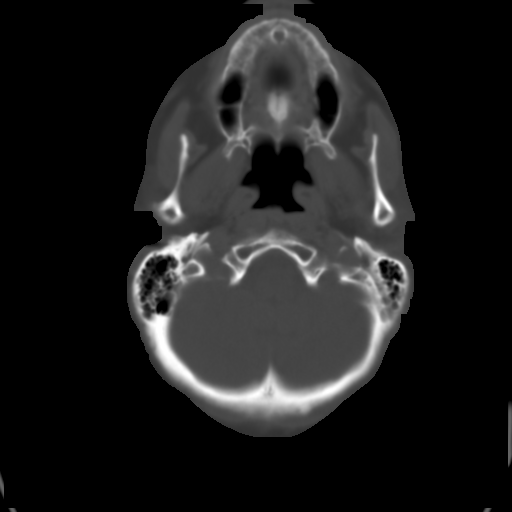
[im 10/34  brain]
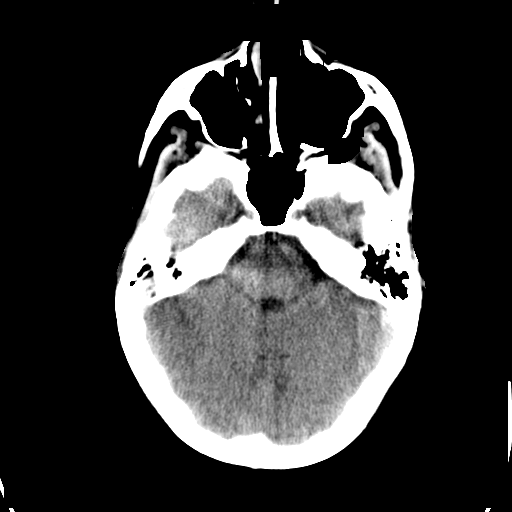
[im 15/34  brain]
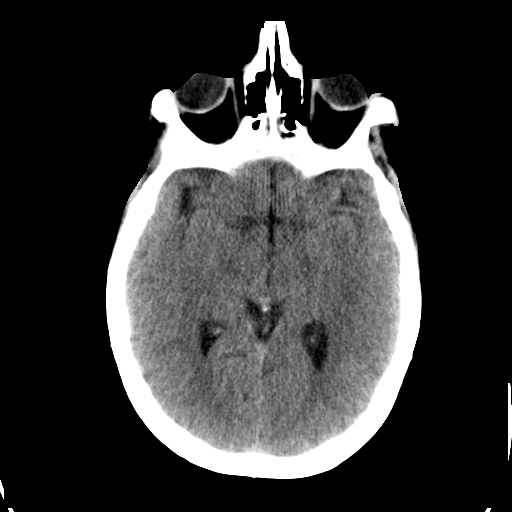
[im 19/34  brain]
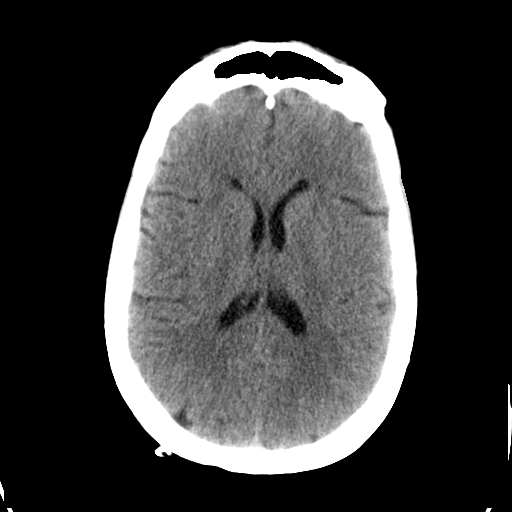
[im 24/34  brain]
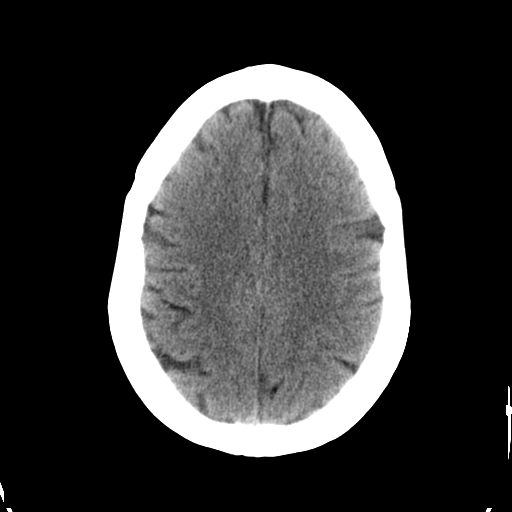
[im 24/34  bone]
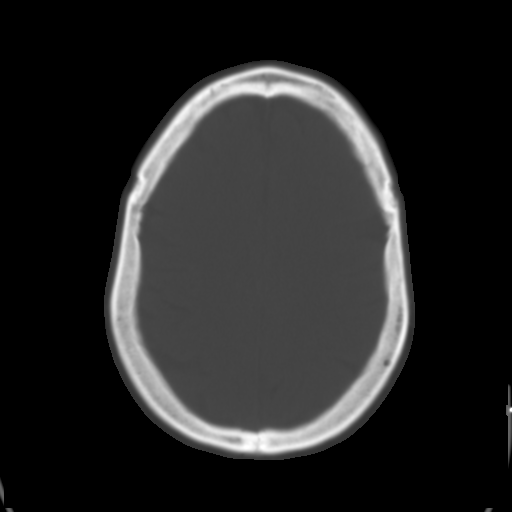
[im 29/34  brain]
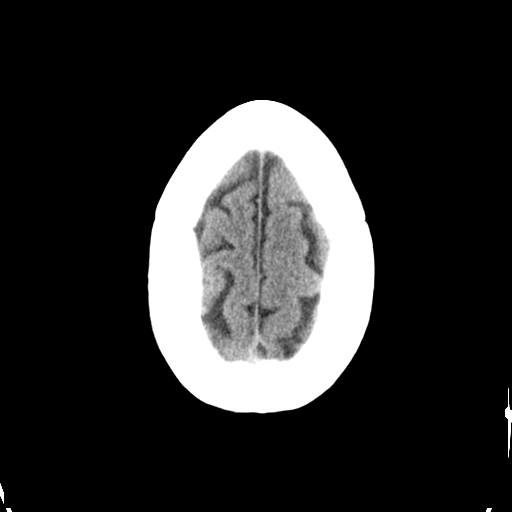

[14 of 30 positions shown; findings below may reference images not displayed]

FINDINGS: No intracranial abnormalities are identified, including mass lesion
or mass effect, hydrocephalus, extra-axial fluid collection, midline
shift, hemorrhage, or acute infarction.

The visualized bony calvarium is unremarkable.

Surgical staples along the posterior right scalp noted.
IMPRESSION: Unremarkable noncontrast head CT except for right scalp surgical
staples.

## 2016-04-06 IMAGING — CR DG CHEST 1V PORT
1 series · 1 of 1 positions shown · non-contrast
Comparison: 08/13/2015

CLINICAL DATA: Weakness, recent head injury

EXAM:
PORTABLE CHEST - 1 VIEW

[x chest ap]
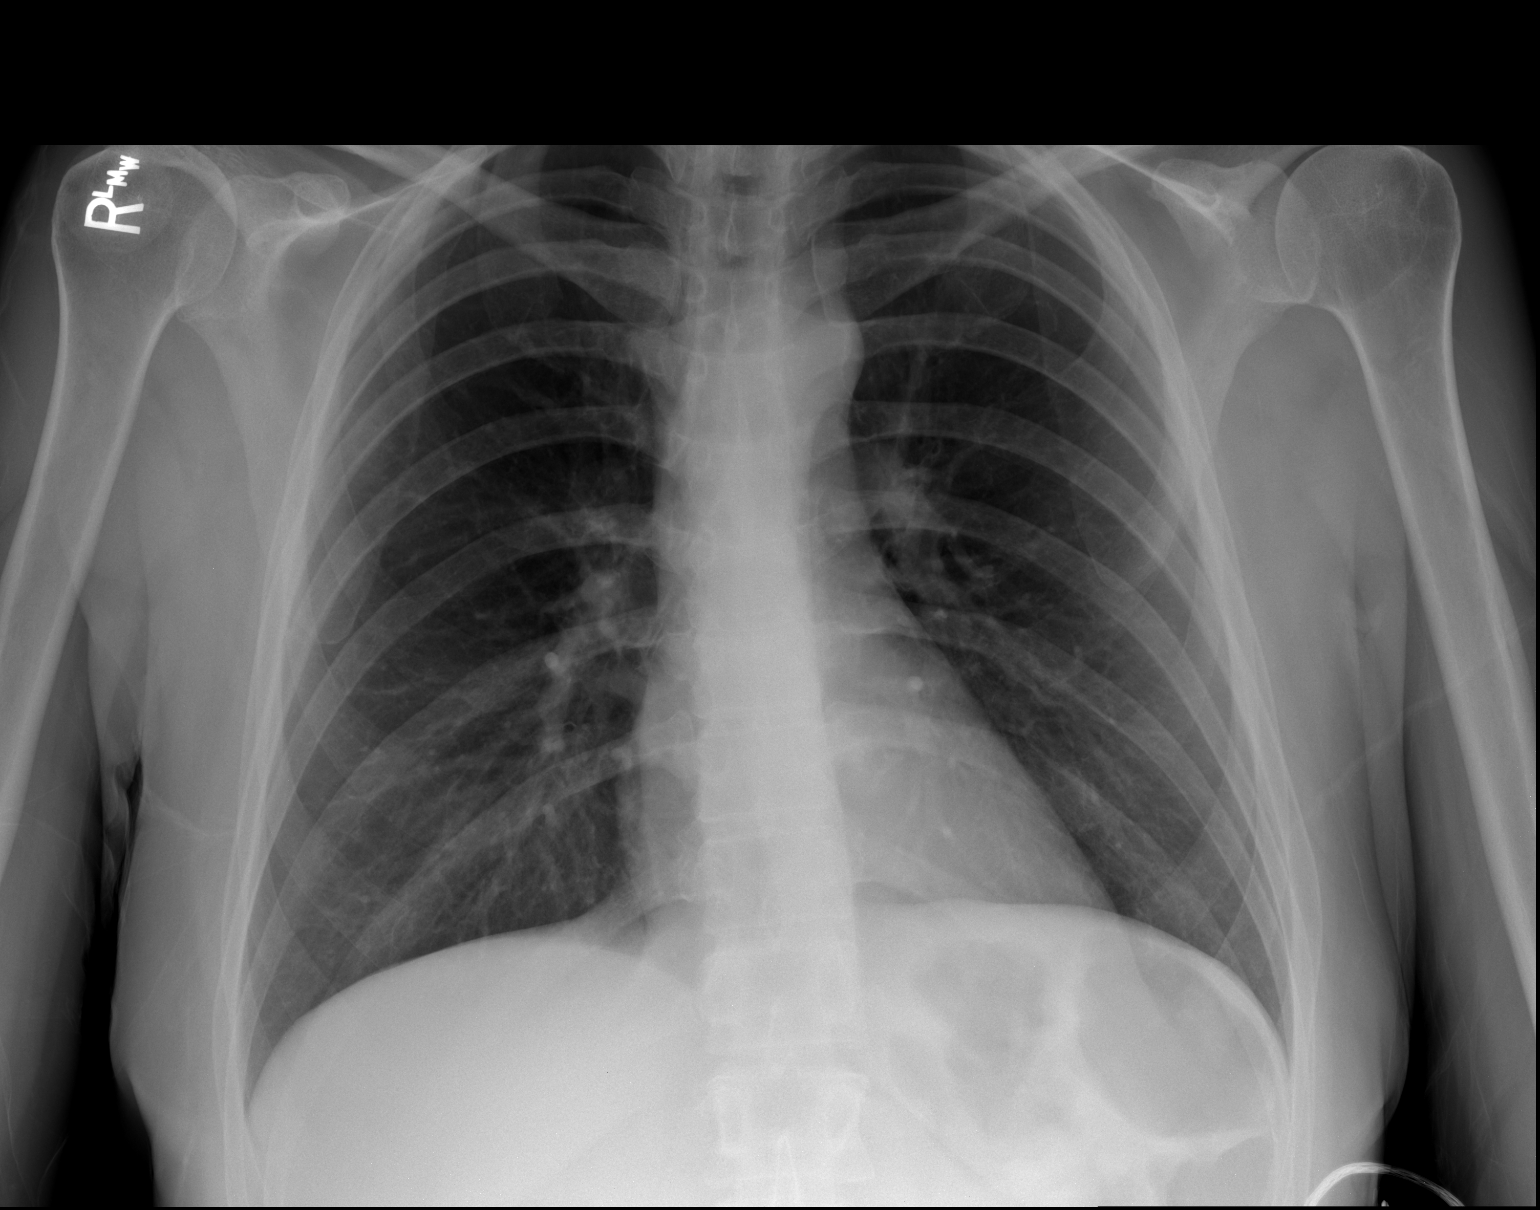

[1 of 1 positions shown; findings below may reference images not displayed]

FINDINGS: Lungs are clear. Heart size and mediastinal contours are within
normal limits.
No effusion.
Visualized skeletal structures are unremarkable.
IMPRESSION: No acute cardiopulmonary disease.

## 2016-04-22 LAB — HM PAP SMEAR

## 2016-05-07 ENCOUNTER — Other Ambulatory Visit: Payer: Self-pay | Admitting: Family Medicine

## 2016-05-07 NOTE — Telephone Encounter (Signed)
error 

## 2016-09-10 ENCOUNTER — Other Ambulatory Visit: Payer: Self-pay | Admitting: Family Medicine

## 2016-11-14 ENCOUNTER — Other Ambulatory Visit: Payer: Self-pay | Admitting: Family Medicine

## 2016-11-14 DIAGNOSIS — F411 Generalized anxiety disorder: Secondary | ICD-10-CM

## 2016-11-15 ENCOUNTER — Telehealth: Payer: Self-pay | Admitting: Family Medicine

## 2016-11-15 NOTE — Telephone Encounter (Signed)
LMOVM for pt to call back 

## 2016-11-15 NOTE — Telephone Encounter (Signed)
Please call in alprazolam. Also, it has been over a year since last visit and needs to schedule follow up in the next month or two.

## 2016-11-15 NOTE — Telephone Encounter (Signed)
Patient returned call and was advised as below. Follow up appointment scheduled 11/29/2016. Prescription called into pharmacy.

## 2016-11-29 ENCOUNTER — Ambulatory Visit: Payer: Self-pay | Admitting: Family Medicine

## 2016-12-06 ENCOUNTER — Ambulatory Visit (INDEPENDENT_AMBULATORY_CARE_PROVIDER_SITE_OTHER): Payer: Self-pay | Admitting: Family Medicine

## 2016-12-06 ENCOUNTER — Encounter: Payer: Self-pay | Admitting: Family Medicine

## 2016-12-06 VITALS — BP 142/82 | HR 52 | Temp 99.1°F | Resp 16 | Wt 146.0 lb

## 2016-12-06 DIAGNOSIS — F411 Generalized anxiety disorder: Secondary | ICD-10-CM

## 2016-12-06 DIAGNOSIS — F17200 Nicotine dependence, unspecified, uncomplicated: Secondary | ICD-10-CM

## 2016-12-06 DIAGNOSIS — J40 Bronchitis, not specified as acute or chronic: Secondary | ICD-10-CM

## 2016-12-06 DIAGNOSIS — I10 Essential (primary) hypertension: Secondary | ICD-10-CM

## 2016-12-06 DIAGNOSIS — F32A Depression, unspecified: Secondary | ICD-10-CM

## 2016-12-06 DIAGNOSIS — G43109 Migraine with aura, not intractable, without status migrainosus: Secondary | ICD-10-CM

## 2016-12-06 DIAGNOSIS — F329 Major depressive disorder, single episode, unspecified: Secondary | ICD-10-CM

## 2016-12-06 MED ORDER — TOPIRAMATE 25 MG PO TABS: 25.0000 mg | ORAL_TABLET | Freq: Two times a day (BID) | ORAL | 3 refills | Status: DC

## 2016-12-06 MED ORDER — SULFAMETHOXAZOLE-TRIMETHOPRIM 800-160 MG PO TABS: 2.0000 | ORAL_TABLET | Freq: Two times a day (BID) | ORAL | 0 refills | Status: AC

## 2016-12-06 MED ORDER — ATENOLOL 50 MG PO TABS: 50.0000 mg | ORAL_TABLET | Freq: Two times a day (BID) | ORAL | 3 refills | Status: DC

## 2016-12-06 MED ORDER — AMLODIPINE BESYLATE 5 MG PO TABS: 5.0000 mg | ORAL_TABLET | Freq: Every day | ORAL | 3 refills | Status: DC

## 2016-12-06 MED ORDER — DULOXETINE HCL 60 MG PO CPEP: 60.0000 mg | ORAL_CAPSULE | Freq: Every day | ORAL | 3 refills | Status: DC

## 2016-12-06 MED ORDER — OMEPRAZOLE 20 MG PO CPDR: 40.0000 mg | DELAYED_RELEASE_CAPSULE | Freq: Every day | ORAL | 5 refills | Status: DC

## 2016-12-06 MED ORDER — ALPRAZOLAM 2 MG PO TABS: 2.0000 mg | ORAL_TABLET | Freq: Three times a day (TID) | ORAL | 2 refills | Status: DC | PRN

## 2016-12-06 NOTE — Progress Notes (Signed)
Patient: Marilyn Brown Female    DOB: 1971/04/18   45 y.o.   MRN: [id number] Visit Date: 12/06/2016  Today's Provider: Mila Merryonald Cejay Cambre, MD   Chief Complaint  Patient presents with  . Anxiety  . Depression  . Hypertension   Subjective:    Hypertension  This is a chronic problem. The problem is controlled (120's / 60-70's). Pertinent negatives include no anxiety, chest pain, headaches, palpitations or shortness of breath.  Anxiety  Presents for follow-up visit. Symptoms include excessive worry, insomnia, nervous/anxious behavior and panic. Patient reports no chest pain, confusion, decreased concentration, depressed mood, dizziness, palpitations, restlessness, shortness of breath or suicidal ideas. The quality of sleep is poor. Nighttime awakenings: several (Pt can go to sleep okay but is not able to stay asleep).    Depression         This is a chronic problem.The problem is unchanged.  Associated symptoms include insomnia.  Associated symptoms include no decreased concentration, no restlessness, no headaches and no suicidal ideas.  Compliance with treatment is good.  Previous treatment provided significant relief.   Pertinent negatives include no anxiety.   Wt Readings from Last 3 Encounters:  12/06/16 146 lb (66.2 kg)  08/15/15 145 lb (65.8 kg)  08/04/15 141 lb (64 kg)       Allergies  Allergen Reactions  . Triamcinolone Swelling  . Prednisone Anxiety    Patient had swelling      Current Outpatient Prescriptions:  .  alprazolam (XANAX) 2 MG tablet, TAKE 1 TABLET BY MOUTH EVERY 8 HOURS AS NEEDED, Disp: 90 tablet, Rfl: 0 .  amLODipine (NORVASC) 5 MG tablet, Take 1 tablet (5 mg total) by mouth daily., Disp: 30 tablet, Rfl: 1 .  atenolol (TENORMIN) 50 MG tablet, TAKE 1 TABLET BY MOUTH TWICE A DAY, Disp: 60 tablet, Rfl: 0 .  baclofen (LIORESAL) 10 MG tablet, Take 10 mg by mouth every 6 (six) hours as needed for muscle spasms. , Disp: , Rfl:  .  DULoxetine (CYMBALTA) 60  MG capsule, Take by mouth., Disp: , Rfl:  .  gabapentin (NEURONTIN) 600 MG tablet, One tab twice a day, Disp: , Rfl:  .  omeprazole (PRILOSEC) 20 MG capsule, Take 40 mg by mouth daily., Disp: , Rfl:  .  topiramate (TOPAMAX) 25 MG tablet, Take 1 tablet (25 mg total) by mouth 2 (two) times daily., Disp: 180 tablet, Rfl: 1  Review of Systems  Constitutional: Negative.   Respiratory: Positive for cough (Pt reports she has a cold. ). Negative for apnea, choking, chest tightness, shortness of breath, wheezing and stridor.   Cardiovascular: Negative for chest pain and palpitations.  Gastrointestinal: Negative.   Musculoskeletal: Negative.   Neurological: Negative for dizziness, light-headedness and headaches.  Psychiatric/Behavioral: Positive for depression and sleep disturbance. Negative for agitation, behavioral problems, confusion, decreased concentration, dysphoric mood, hallucinations, self-injury and suicidal ideas. The patient is nervous/anxious and has insomnia. The patient is not hyperactive.     Social History  Substance Use Topics  . Smoking status: Current Every Day Smoker    Packs/day: 0.50    Years: 22.00  . Smokeless tobacco: Never Used     Comment: smokes <1 pack of cigarettes per day, has been smoking for 22 years  . Alcohol use 0.0 oz/week     Comment: moderate alcohol use   Objective:   BP (!) 142/82 (BP Location: Left Arm, Patient Position: Sitting, Cuff Size: Normal)   Pulse (!) 52  Temp 99.1 F (37.3 C) (Oral)   Resp 16   Wt 146 lb (66.2 kg)   BMI 25.06 kg/m     Physical Exam   General Appearance:    Alert, cooperative, no distress  Eyes:    PERRL, conjunctiva/corneas clear, EOM's intact       Lungs:     Clear to auscultation bilaterally, respirations unlabored  Heart:    Regular rate and rhythm  Neurologic:   Awake, alert, oriented x 3. No apparent focal neurological           defect.           Assessment & Plan:     1. Essential  hypertension Stable Continue current medications.   - amLODipine (NORVASC) 5 MG tablet; Take 1 tablet (5 mg total) by mouth daily.  Dispense: 90 tablet; Refill: 3 - atenolol (TENORMIN) 50 MG tablet; Take 1 tablet (50 mg total) by mouth 2 (two) times daily.  Dispense: 180 tablet; Refill: 3  2. Depression, unspecified depression type Doing well con cymbalta.  - DULoxetine (CYMBALTA) 60 MG capsule; Take 1 capsule (60 mg total) by mouth daily.  Dispense: 90 capsule; Refill: 3  3. Generalized anxiety disorder Stable on alprazolam which she takes a few times  A week.  - alprazolam (XANAX) 2 MG tablet; Take 1 tablet (2 mg total) by mouth every 8 (eight) hours as needed.  Dispense: 90 tablet; Refill: 2  4. Bronchitis  - sulfamethoxazole-trimethoprim (BACTRIM DS,SEPTRA DS) 800-160 MG tablet; Take 2 tablets by mouth 2 (two) times daily.  Dispense: 20 tablet; Refill: 0  5. Migraine with aura and without status migrainosus, not intractable Continue Topamax.   6. Compulsive tobacco user syndrome Encouraged to stop smoking.        Mila Merry, MD  [facility]

## 2017-05-02 ENCOUNTER — Telehealth: Payer: Self-pay | Admitting: Family Medicine

## 2017-05-02 DIAGNOSIS — Z1231 Encounter for screening mammogram for malignant neoplasm of breast: Secondary | ICD-10-CM

## 2017-05-02 DIAGNOSIS — Z87898 Personal history of other specified conditions: Secondary | ICD-10-CM

## 2017-05-02 NOTE — Telephone Encounter (Signed)
Please advise 

## 2017-05-02 NOTE — Telephone Encounter (Signed)
Pt stated that she needs to have her mammogram done and is requesting [facility] to [facility] for Bilateral Dx Mamm[facility]. Please advise. Thanks TNP

## 2017-05-02 NOTE — Telephone Encounter (Signed)
OK to order screening mammogram.  

## 2017-05-05 NOTE — Telephone Encounter (Signed)
Patient was notified.

## 2017-05-05 NOTE — Telephone Encounter (Signed)
Screening mammogram ordered and faxed. Fax #[phone].

## 2017-05-16 ENCOUNTER — Telehealth: Payer: Self-pay | Admitting: Family Medicine

## 2017-05-16 NOTE — Telephone Encounter (Signed)
Appointment scheduled at [facility] for diagnostic bilateral mammogram 06/17/17 at 1:30.

## 2017-07-03 ENCOUNTER — Other Ambulatory Visit: Payer: Self-pay | Admitting: Family Medicine

## 2017-07-03 DIAGNOSIS — F411 Generalized anxiety disorder: Secondary | ICD-10-CM

## 2017-07-03 NOTE — Telephone Encounter (Signed)
Please call in alprazolam.  

## 2017-07-03 NOTE — Telephone Encounter (Signed)
RX called in-Thornton Dohrmann V Rye Decoste, RMA  

## 2017-07-03 NOTE — Telephone Encounter (Signed)
Pt contacted office for refill request on the following medications:  alprazolam (XANAX) 2 MG tablet   [facility] [place], [place].  [phone]

## 2017-08-25 LAB — HM MAMMOGRAPHY

## 2017-08-26 ENCOUNTER — Encounter: Payer: Self-pay | Admitting: *Deleted

## 2017-09-03 ENCOUNTER — Other Ambulatory Visit: Payer: Self-pay | Admitting: Family Medicine

## 2017-09-03 MED ORDER — OMEPRAZOLE 20 MG PO CPDR: 40.0000 mg | DELAYED_RELEASE_CAPSULE | Freq: Every day | ORAL | 3 refills | Status: DC

## 2017-09-03 NOTE — Telephone Encounter (Signed)
Pt contacted office for refill request on the following medications:  omeprazole (PRILOSEC) 20 MG capsule  90 day supply  [facility] [place] - [address]  Pt stated that she was advised by the pharmacy that she doesn't have any refills on the medication. Please advise. Thanks TNP

## 2017-09-16 ENCOUNTER — Telehealth: Payer: Self-pay | Admitting: Family Medicine

## 2017-09-16 DIAGNOSIS — F329 Major depressive disorder, single episode, unspecified: Secondary | ICD-10-CM

## 2017-09-16 DIAGNOSIS — F32A Depression, unspecified: Secondary | ICD-10-CM

## 2017-09-16 MED ORDER — TOPIRAMATE 25 MG PO TABS: ORAL_TABLET | ORAL | 0 refills | Status: DC

## 2017-09-16 MED ORDER — DULOXETINE HCL 30 MG PO CPEP: 90.0000 mg | ORAL_CAPSULE | Freq: Every day | ORAL | 0 refills | Status: DC

## 2017-09-16 NOTE — Telephone Encounter (Signed)
Pt contacted office for refill request on the following medications:  DULoxetine (CYMBALTA) 60 MG capsule.  Pt states Dr Rachel BoIrina Phillips/[facility] advided pt that she should be taking 90 MG.  topiramate (TOPAMAX) 25 MG tablet.  Pt states Dr Rachel BoIrina Phillips/[facility] advided pt that she should be taking 50 MG 2 times a day.  [facility].  CB#[phone]/MW

## 2017-09-16 NOTE — Telephone Encounter (Signed)
Please advise 

## 2017-10-02 ENCOUNTER — Telehealth: Payer: Self-pay | Admitting: Family Medicine

## 2017-10-02 NOTE — Telephone Encounter (Signed)
I don't know what this call is about. Medications were refilled on 09-16-2017, I don't see any messages since then.

## 2017-10-02 NOTE — Telephone Encounter (Signed)
Please advise 

## 2017-10-02 NOTE — Telephone Encounter (Signed)
Pt is calling back concerning changes to cymbalta and topamax.See previous message

## 2017-10-03 NOTE — Telephone Encounter (Signed)
Called pt back. Patient did not realize the rx's had been sent to pharmacy. She said it takes a while for [facility] to get meds mailed out to patients after they receive an rx.

## 2017-10-07 ENCOUNTER — Telehealth: Payer: Self-pay | Admitting: Family Medicine

## 2017-10-07 NOTE — Telephone Encounter (Signed)
Patient states that she has not received her Cymbalta from the pharmacy and they say they do not have it.  She did receive the topamax 25mg  but she thinks it is the wrong dose.  She states that she discussed that she thinks her Cymbalta 30mg  and Topamax 25mg  should have been a different dose that was recommended Dr. Vear ClockPhillips at pain management.  She uses [facility] for her Pharmacy.  There is a message concerning the mg changes that she requested on 09/16/2017.

## 2017-10-07 NOTE — Telephone Encounter (Addendum)
Dr. Tawanna SoloPhillps wanted her titrate up to 50mg  twice a day of Topamax and 90mg  a day of Cymbalta.    I sent prescription to titrate up to 2x25mg  topamax twice a day and 3x30mg  cymbalta in December and she was supposed to schedule a follow up this month.   What is she taking now? May need to check with pharmacy to make sure they got rxs that were sent last month.

## 2017-10-07 NOTE — Telephone Encounter (Signed)
Please review. Thanks!  

## 2017-10-08 ENCOUNTER — Encounter: Payer: Self-pay | Admitting: Family Medicine

## 2017-10-08 NOTE — Telephone Encounter (Signed)
Per [facility], rx's were received on 09/16/2017. Rx's were sent out to patient 09/26/2017. Tried calling pt, no answer or vm.

## 2017-10-08 NOTE — Telephone Encounter (Signed)
Patient is returning you call and requesting a call back.

## 2017-10-08 NOTE — Telephone Encounter (Signed)
Patient states she has been taking Topamax 25 mg 2 tablets at bedtime. Patient did not read the new directions on bottle or remember directions given over the phone last week. Went back over new directions with patient. Patient states she did not receive Cymbalta in the mail. She is going to call pharmacy back tomorrow to discuss missing medication. Patient has an ov scheduled 10/27/2017.

## 2017-10-27 ENCOUNTER — Ambulatory Visit: Payer: Self-pay | Admitting: Family Medicine

## 2017-10-27 NOTE — Progress Notes (Deleted)
       Patient: Marilyn Brown Female    DOB: 1971-09-29   47 y.o.   MRN: [id number] Visit Date: 10/27/2017  Today's Provider: Mila Merryonald Fisher, MD   No chief complaint on file.  Subjective:    HPI   Depression From 09/16/2017-titrated Topamax up to 25 mg x2 bid and Cymbalta to 90 mg qd.   Allergies  Allergen Reactions  . Triamcinolone Swelling  . Prednisone Anxiety    Patient had swelling      Current Outpatient Medications:  .  alprazolam (XANAX) 2 MG tablet, TAKE 1 TABLET BY MOUTH EVERY 8 HOURS AS NEEDED, Disp: 90 tablet, Rfl: 3 .  amLODipine (NORVASC) 5 MG tablet, Take 1 tablet (5 mg total) by mouth daily., Disp: 90 tablet, Rfl: 3 .  atenolol (TENORMIN) 50 MG tablet, Take 1 tablet (50 mg total) by mouth 2 (two) times daily., Disp: 180 tablet, Rfl: 3 .  baclofen (LIORESAL) 10 MG tablet, Take 10 mg by mouth every 6 (six) hours as needed for muscle spasms. , Disp: , Rfl:  .  DULoxetine (CYMBALTA) 30 MG capsule, Take 3 capsules (90 mg total) by mouth daily., Disp: 270 capsule, Rfl: 0 .  gabapentin (NEURONTIN) 600 MG tablet, One tab twice a day, Disp: , Rfl:  .  omeprazole (PRILOSEC) 20 MG capsule, Take 2 capsules (40 mg total) by mouth daily., Disp: 180 capsule, Rfl: 3 .  topiramate (TOPAMAX) 25 MG tablet, Take one in the morning and two at night for 7 days, then increase to 2 tablets twice a day., Disp: 360 tablet, Rfl: 0  Review of Systems  Constitutional: Negative for appetite change, chills, fatigue and fever.  Respiratory: Negative for chest tightness and shortness of breath.   Cardiovascular: Negative for chest pain and palpitations.  Gastrointestinal: Negative for abdominal pain, nausea and vomiting.  Neurological: Negative for dizziness and weakness.    Social History   Tobacco Use  . Smoking status: Current Every Day Smoker    Packs/day: 0.50    Years: 22.00    Pack years: 11.00  . Smokeless tobacco: Never Used  . Tobacco comment: smokes <1 pack of  cigarettes per day, since ago   Substance Use Topics  . Alcohol use: Yes    Alcohol/week: 0.0 oz    Comment: moderate alcohol use   Objective:   There were no vitals taken for this visit. There were no vitals filed for this visit.   Physical Exam      Assessment & Plan:           Mila Merryonald Fisher, MD  [facility]

## 2017-11-03 ENCOUNTER — Ambulatory Visit: Payer: Self-pay | Admitting: Family Medicine

## 2017-11-03 ENCOUNTER — Encounter: Payer: Self-pay | Admitting: Family Medicine

## 2017-11-03 VITALS — BP 120/70 | HR 60 | Temp 98.2°F | Resp 16 | Wt 138.0 lb

## 2017-11-03 DIAGNOSIS — R251 Tremor, unspecified: Secondary | ICD-10-CM

## 2017-11-03 DIAGNOSIS — F329 Major depressive disorder, single episode, unspecified: Secondary | ICD-10-CM

## 2017-11-03 DIAGNOSIS — F32A Depression, unspecified: Secondary | ICD-10-CM

## 2017-11-03 DIAGNOSIS — F411 Generalized anxiety disorder: Secondary | ICD-10-CM

## 2017-11-03 DIAGNOSIS — I10 Essential (primary) hypertension: Secondary | ICD-10-CM

## 2017-11-03 DIAGNOSIS — G43109 Migraine with aura, not intractable, without status migrainosus: Secondary | ICD-10-CM

## 2017-11-03 DIAGNOSIS — H5712 Ocular pain, left eye: Secondary | ICD-10-CM

## 2017-11-03 MED ORDER — BUDESONIDE-FORMOTEROL FUMARATE 160-4.5 MCG/ACT IN AERO: 2.0000 | INHALATION_SPRAY | Freq: Two times a day (BID) | RESPIRATORY_TRACT | 4 refills | Status: DC

## 2017-11-03 MED ORDER — GABAPENTIN 300 MG PO CAPS: ORAL_CAPSULE | ORAL | 0 refills | Status: AC

## 2017-11-03 MED ORDER — TOPIRAMATE 25 MG PO TABS: 50.0000 mg | ORAL_TABLET | Freq: Every day | ORAL | 0 refills | Status: DC

## 2017-11-03 MED ORDER — MIRTAZAPINE 15 MG PO TABS: 7.5000 mg | ORAL_TABLET | Freq: Every day | ORAL | 2 refills | Status: DC

## 2017-11-03 NOTE — Progress Notes (Signed)
Patient: Marilyn Brown Female    DOB: 1971-09-23   47 y.o.   MRN: [id number] Visit Date: 11/03/2017  Today's Provider: Mila Merry, MD   Chief Complaint  Patient presents with  . Depression  dep Subjective:    HPI Follow up of Depression: Patient was last seen for this visit 10 month ago. Changes made since that visit includes titration of Topamax up to 25mg  2 pills twice a day and 90mg  of Cymbalta daily per recommendations from pain clinic. Marland Kitchen Patient reports fair compliance with treatment, fair tolerance and fair symptom control. Patient states she is currently taking 2 tablets of Topamx at night due to it causing excessive sleepiness. She states that since the last visit, she has had shakes in her hands.     Allergies  Allergen Reactions  . Triamcinolone Swelling  . Prednisone Anxiety    Patient had swelling      Current Outpatient Medications:  .  alprazolam (XANAX) 2 MG tablet, TAKE 1 TABLET BY MOUTH EVERY 8 HOURS AS NEEDED, Disp: 90 tablet, Rfl: 3 .  amLODipine (NORVASC) 5 MG tablet, Take 1 tablet (5 mg total) by mouth daily., Disp: 90 tablet, Rfl: 3 .  atenolol (TENORMIN) 50 MG tablet, Take 1 tablet (50 mg total) by mouth 2 (two) times daily., Disp: 180 tablet, Rfl: 3 .  baclofen (LIORESAL) 10 MG tablet, Take 20 mg by mouth 3 (three) times daily. , Disp: , Rfl:  .  budesonide-formoterol (SYMBICORT) 160-4.5 MCG/ACT inhaler, Inhale 2 puffs into the lungs 2 (two) times daily., Disp: , Rfl:  .  DULoxetine (CYMBALTA) 30 MG capsule, Take 3 capsules (90 mg total) by mouth daily., Disp: 270 capsule, Rfl: 0 .  gabapentin (NEURONTIN) 300 MG capsule, Take two tablets three times daily, and three tablets at bedtime, Disp: 1 capsule, Rfl: 0 .  omeprazole (PRILOSEC) 20 MG capsule, Take 2 capsules (40 mg total) by mouth daily., Disp: 180 capsule, Rfl: 3 .  topiramate (TOPAMAX) 25 MG tablet, Take 2 tablets (50 mg total) by mouth at bedtime., Disp: 3 tablet, Rfl: 0 .  mirtazapine  (REMERON) 15 MG tablet, Take 0.5 tablets (7.5 mg total) by mouth at bedtime., Disp: 15 tablet, Rfl: 2  Review of Systems  Constitutional: Positive for fatigue. Negative for appetite change, chills and fever.  Eyes:       Decrease in vision   Respiratory: Negative for chest tightness and shortness of breath.   Cardiovascular: Negative for chest pain and palpitations.  Gastrointestinal: Negative for abdominal pain, nausea and vomiting.  Musculoskeletal: Positive for back pain.  Neurological: Positive for tremors (in hands). Negative for dizziness and weakness.  Psychiatric/Behavioral: Positive for sleep disturbance.    Social History   Tobacco Use  . Smoking status: Current Every Day Smoker    Packs/day: 0.50    Years: 22.00    Pack years: 11.00  . Smokeless tobacco: Never Used  . Tobacco comment: smokes <1 pack of cigarettes per day, since ago   Substance Use Topics  . Alcohol use: Yes    Alcohol/week: 0.0 oz    Comment: moderate alcohol use   Objective:   BP 120/70   Pulse 60   Temp 98.2 F (36.8 C) (Oral)   Resp 16   Wt 138 lb (62.6 kg)   SpO2 95% Comment: room air  BMI 23.69 kg/m  There were no vitals filed for this visit.   Physical Exam   General Appearance:  Alert, cooperative, no distress  Eyes:    PERRL, conjunctiva/corneas clear, EOM's intact       Lungs:     Clear to auscultation bilaterally, respirations unlabored  Heart:    Regular rate and rhythm  Neurologic:   Awake, alert, oriented x 3. No apparent focal neurological           defect.           Assessment & Plan:     1. Depression, unspecified depression type  - mirtazapine (REMERON) 15 MG tablet; Take 0.5 tablets (7.5 mg total) by mouth at bedtime.  Dispense: 15 tablet; Refill: 2  2. Essential hypertension Well controlled.   3. Generalized anxiety disorder Continue current dose of cymbalta and alprazolam   4. Migraine with aura and without status migrainosus, not intractable  -  gabapentin (NEURONTIN) 300 MG capsule; Take two tablets three times daily, and three tablets at bedtime  Dispense: 1 capsule; Refill: 0 - topiramate (TOPAMAX) 25 MG tablet; Take 2 tablets (50 mg total) by mouth at bedtime.  Dispense: 3 tablet; Refill: 0  5. Eye pain, left  - Ambulatory referral to Ophthalmology  6. Tremor  - Comprehensive metabolic panel - TSH       Mila Merryonald Jeven Topper, MD  [facility]

## 2017-11-04 ENCOUNTER — Telehealth: Payer: Self-pay

## 2017-11-04 DIAGNOSIS — R251 Tremor, unspecified: Secondary | ICD-10-CM

## 2017-11-04 LAB — COMPREHENSIVE METABOLIC PANEL
ALBUMIN: 4.4 g/dL (ref 3.5–5.5)
ALT: 10 IU/L (ref 0–32)
AST: 18 IU/L (ref 0–40)
Albumin/Globulin Ratio: 1.6 (ref 1.2–2.2)
Alkaline Phosphatase: 76 IU/L (ref 39–117)
BUN/Creatinine Ratio: 21 (ref 9–23)
BUN: 18 mg/dL (ref 6–24)
Bilirubin Total: <0.2 mg/dL (ref 0.0–1.2)
CO2: 21 mmol/L (ref 20–29)
CREATININE: 0.84 mg/dL (ref 0.57–1.00)
Calcium: 9.5 mg/dL (ref 8.7–10.2)
Chloride: 105 mmol/L (ref 96–106)
GFR calc Af Amer: 96 mL/min/{1.73_m2} (ref >59)
GFR, EST NON AFRICAN AMERICAN: 84 mL/min/{1.73_m2} (ref >59)
GLOBULIN, TOTAL: 2.8 g/dL (ref 1.5–4.5)
Glucose: 78 mg/dL (ref 65–99)
Potassium: 4.1 mmol/L (ref 3.5–5.2)
SODIUM: 144 mmol/L (ref 134–144)
Total Protein: 7.2 g/dL (ref 6.0–8.5)

## 2017-11-04 LAB — TSH: TSH: 0.564 u[IU]/mL (ref 0.450–4.500)

## 2017-11-04 NOTE — Telephone Encounter (Signed)
-----   Message from Malva Limesonald E Fisher, MD sent at 11/04/2017  7:56 AM EST ----- Labs are all normal. No explanation for tremors or jerking motions. [facility].

## 2017-11-04 NOTE — Telephone Encounter (Signed)
Advised patient as below. Referral for neurology was placed.

## 2017-11-27 ENCOUNTER — Other Ambulatory Visit: Payer: Self-pay | Admitting: Family Medicine

## 2017-11-27 DIAGNOSIS — G43109 Migraine with aura, not intractable, without status migrainosus: Secondary | ICD-10-CM

## 2017-11-27 NOTE — Telephone Encounter (Signed)
Requesting 90 day supply.

## 2017-11-27 NOTE — Telephone Encounter (Signed)
[facility] pharmacy faxed a refill request for a 90-days supply for the following medication. Thanks CC  topiramate (TOPAMAX) 25 MG tablet

## 2017-11-28 MED ORDER — TOPIRAMATE 25 MG PO TABS: 25.0000 mg | ORAL_TABLET | Freq: Two times a day (BID) | ORAL | 3 refills | Status: DC

## 2017-12-02 ENCOUNTER — Other Ambulatory Visit: Payer: Self-pay | Admitting: *Deleted

## 2017-12-02 DIAGNOSIS — F329 Major depressive disorder, single episode, unspecified: Secondary | ICD-10-CM

## 2017-12-02 DIAGNOSIS — F32A Depression, unspecified: Secondary | ICD-10-CM

## 2017-12-02 MED ORDER — DULOXETINE HCL 30 MG PO CPEP: 90.0000 mg | ORAL_CAPSULE | Freq: Every day | ORAL | 2 refills | Status: DC

## 2017-12-03 ENCOUNTER — Telehealth: Payer: Self-pay

## 2017-12-03 NOTE — Telephone Encounter (Signed)
Pt gets medications through [facility], and they do not carry the Symbicort through the program. She states she has Proventil on her profile through this program. She is asking for a refill on the Proventil instead of the Symbicort. Please review.

## 2017-12-03 NOTE — Telephone Encounter (Signed)
Please advise 

## 2017-12-03 NOTE — Telephone Encounter (Signed)
The Proventil is a rescue inhaler, while the Symbicort is a maintenance inhaler. She needs to find out what maintenance inhalers they have. They should have something like Advair or Flovent.

## 2017-12-03 NOTE — Telephone Encounter (Signed)
Patient stated she will call the pharmacy in the morning to find out. Patient will let us know.

## 2017-12-04 MED ORDER — MOMETASONE FURO-FORMOTEROL FUM 200-5 MCG/ACT IN AERO: 2.0000 | INHALATION_SPRAY | Freq: Two times a day (BID) | RESPIRATORY_TRACT | 4 refills | Status: DC

## 2017-12-04 NOTE — Telephone Encounter (Signed)
Pt states she knows the maintenance inhaler name is  Delera

## 2017-12-04 NOTE — Telephone Encounter (Signed)
[facility]

## 2017-12-05 NOTE — Telephone Encounter (Signed)
Patient was advised.  

## 2017-12-08 ENCOUNTER — Other Ambulatory Visit: Payer: Self-pay | Admitting: Family Medicine

## 2017-12-08 DIAGNOSIS — I10 Essential (primary) hypertension: Secondary | ICD-10-CM

## 2017-12-08 MED ORDER — AMLODIPINE BESYLATE 5 MG PO TABS: 5.0000 mg | ORAL_TABLET | Freq: Every day | ORAL | 3 refills | Status: DC

## 2017-12-08 NOTE — Telephone Encounter (Signed)
Pt contacted office for refill request on the following medications:  amLODipine (NORVASC) 5 MG tablet  [facility]  90 day supply  Last Rx: 12/06/17 LOV: 11/03/17 Please advise. Thanks TNP

## 2017-12-10 ENCOUNTER — Telehealth: Payer: Self-pay | Admitting: Family Medicine

## 2017-12-10 NOTE — Telephone Encounter (Addendum)
[facility] 12/08/2017.

## 2017-12-10 NOTE — Telephone Encounter (Signed)
[facility] faxed refill request for following amLODipine (NORVASC) 5 MG tablet   medications: 90 day supply   Please advise,Thanks Bartelso

## 2017-12-15 ENCOUNTER — Telehealth: Payer: Self-pay | Admitting: Family Medicine

## 2017-12-15 DIAGNOSIS — I10 Essential (primary) hypertension: Secondary | ICD-10-CM

## 2017-12-15 DIAGNOSIS — F329 Major depressive disorder, single episode, unspecified: Secondary | ICD-10-CM

## 2017-12-15 DIAGNOSIS — G43109 Migraine with aura, not intractable, without status migrainosus: Secondary | ICD-10-CM

## 2017-12-15 DIAGNOSIS — F32A Depression, unspecified: Secondary | ICD-10-CM

## 2017-12-15 NOTE — Telephone Encounter (Signed)
We sent 90 day prescription for all of these to [facility] last week.

## 2017-12-15 NOTE — Telephone Encounter (Signed)
Please review. Thanks!  

## 2017-12-15 NOTE — Telephone Encounter (Signed)
Patient is requesting a refill on the following medications.  She states that we only sent in a 30 day supply and she needs a 90 day supply.    DULoxetine (CYMBALTA) 30 MG capsule   amLODipine (NORVASC) 5 MG tablet  topiramate (TOPAMAX) 25 MG tablet  Omeprazole 40 mg  She uses [facility] for pharmacy.

## 2018-01-21 ENCOUNTER — Other Ambulatory Visit: Payer: Self-pay | Admitting: Family Medicine

## 2018-01-21 DIAGNOSIS — F411 Generalized anxiety disorder: Secondary | ICD-10-CM

## 2018-01-21 DIAGNOSIS — I10 Essential (primary) hypertension: Secondary | ICD-10-CM

## 2018-01-22 NOTE — Telephone Encounter (Signed)
Pt called confirming we had recd a request for her prescriptions.  She is running out please refill asap/  teri

## 2018-01-22 NOTE — Telephone Encounter (Signed)
Pharmacy requesting refills. Thanks!  

## 2018-02-02 ENCOUNTER — Other Ambulatory Visit: Payer: Self-pay | Admitting: Family Medicine

## 2018-02-02 DIAGNOSIS — F329 Major depressive disorder, single episode, unspecified: Secondary | ICD-10-CM

## 2018-02-02 DIAGNOSIS — F32A Depression, unspecified: Secondary | ICD-10-CM

## 2018-04-24 MED ORDER — ONDANSETRON 4 MG PO TBDP
4.00 | ORAL_TABLET | ORAL | Status: DC
Start: ? — End: 2018-04-24

## 2018-04-24 MED ORDER — PANTOPRAZOLE SODIUM 20 MG PO TBEC
20.00 | DELAYED_RELEASE_TABLET | ORAL | Status: DC
Start: 2018-04-25 — End: 2018-04-24

## 2018-04-24 MED ORDER — ENOXAPARIN SODIUM 40 MG/0.4ML ~~LOC~~ SOLN
40.00 | SUBCUTANEOUS | Status: DC
Start: 2018-04-25 — End: 2018-04-24

## 2018-04-24 MED ORDER — GENERIC EXTERNAL MEDICATION
Status: DC
Start: ? — End: 2018-04-24

## 2018-04-24 MED ORDER — ATENOLOL 50 MG PO TABS
50.00 | ORAL_TABLET | ORAL | Status: DC
Start: 2018-04-24 — End: 2018-04-24

## 2018-04-24 MED ORDER — BISACODYL 5 MG PO TBEC
10.00 | DELAYED_RELEASE_TABLET | ORAL | Status: DC
Start: ? — End: 2018-04-24

## 2018-04-24 MED ORDER — GABAPENTIN 300 MG PO CAPS
900.00 | ORAL_CAPSULE | ORAL | Status: DC
Start: 2018-04-24 — End: 2018-04-24

## 2018-04-24 MED ORDER — SODIUM CHLORIDE 0.9 % IV SOLN
100.00 | INTRAVENOUS | Status: DC
Start: ? — End: 2018-04-24

## 2018-04-24 MED ORDER — BACLOFEN 10 MG PO TABS
20.00 | ORAL_TABLET | ORAL | Status: DC
Start: 2018-04-24 — End: 2018-04-24

## 2018-04-24 MED ORDER — TOPIRAMATE 25 MG PO TABS
25.00 | ORAL_TABLET | ORAL | Status: DC
Start: 2018-04-24 — End: 2018-04-24

## 2018-04-24 MED ORDER — AMLODIPINE BESYLATE 5 MG PO TABS
5.00 | ORAL_TABLET | ORAL | Status: DC
Start: 2018-04-25 — End: 2018-04-24

## 2018-04-24 MED ORDER — ACETAMINOPHEN 325 MG PO TABS
650.00 | ORAL_TABLET | ORAL | Status: DC
Start: ? — End: 2018-04-24

## 2018-04-24 MED ORDER — GENERIC EXTERNAL MEDICATION
90.00 | Status: DC
Start: 2018-04-25 — End: 2018-04-24

## 2018-04-24 MED ORDER — VARENICLINE TARTRATE 0.5 MG PO TABS
0.50 | ORAL_TABLET | ORAL | Status: DC
Start: 2018-04-25 — End: 2018-04-24

## 2018-04-24 MED ORDER — GENERIC EXTERNAL MEDICATION
2.00 | Status: DC
Start: ? — End: 2018-04-24

## 2018-05-03 ENCOUNTER — Other Ambulatory Visit: Payer: Self-pay | Admitting: Family Medicine

## 2018-05-03 DIAGNOSIS — F32A Depression, unspecified: Secondary | ICD-10-CM

## 2018-05-03 DIAGNOSIS — F329 Major depressive disorder, single episode, unspecified: Secondary | ICD-10-CM

## 2018-05-20 ENCOUNTER — Other Ambulatory Visit: Payer: Self-pay | Admitting: Family Medicine

## 2018-05-20 DIAGNOSIS — F32A Depression, unspecified: Secondary | ICD-10-CM

## 2018-05-20 DIAGNOSIS — F329 Major depressive disorder, single episode, unspecified: Secondary | ICD-10-CM

## 2018-05-20 MED ORDER — MIRTAZAPINE 15 MG PO TABS: ORAL_TABLET | ORAL | 5 refills | Status: DC

## 2018-05-20 NOTE — Telephone Encounter (Signed)
Pt needs a refill   Generic Remeron 15 mg  1/2 at bedtime  [facility]  [place]  Pt's CB [phone]  Thanks  Barth Kirksteri

## 2018-05-21 ENCOUNTER — Other Ambulatory Visit: Payer: Self-pay | Admitting: Family Medicine

## 2018-05-21 ENCOUNTER — Telehealth: Payer: Self-pay | Admitting: Family Medicine

## 2018-05-21 DIAGNOSIS — F32A Depression, unspecified: Secondary | ICD-10-CM

## 2018-05-21 DIAGNOSIS — F329 Major depressive disorder, single episode, unspecified: Secondary | ICD-10-CM

## 2018-05-21 NOTE — Telephone Encounter (Signed)
Pt called wanting to know if the rx for Mirtazapine 15 mg has been sent to [facility] in [place].  She is about to run out of medication  Thanks teri

## 2018-05-21 NOTE — Telephone Encounter (Signed)
See other message

## 2018-05-21 NOTE — Telephone Encounter (Signed)
Pt called saying [facility] is holding her rx for Remeron saying it is too early for her to pick up.   The prescription says 1/2 tab at night and she takes a whole one at night therefore she has ran out early.  She wants to know if you can call [facility] and ok for her to pick up early.    CB# [phone]   Thanks Barth Kirksteri

## 2018-05-21 NOTE — Telephone Encounter (Signed)
Please advise 

## 2018-05-22 MED ORDER — MIRTAZAPINE 15 MG PO TABS: 15.0000 mg | ORAL_TABLET | Freq: Every day | ORAL | 5 refills | Status: DC

## 2018-05-22 NOTE — Telephone Encounter (Signed)
I called patient and she verified [facility] [place].

## 2018-05-22 NOTE — Telephone Encounter (Signed)
Can new prescription with corrected sig. Please verify, [facility] [place]

## 2018-07-26 ENCOUNTER — Other Ambulatory Visit: Payer: Self-pay | Admitting: Family Medicine

## 2018-07-26 DIAGNOSIS — F411 Generalized anxiety disorder: Secondary | ICD-10-CM

## 2018-07-29 ENCOUNTER — Telehealth: Payer: Self-pay | Admitting: Family Medicine

## 2018-07-29 NOTE — Telephone Encounter (Signed)
Pt stated that she had been waking up in the middle of the night with really bad joint pain. Pt is requesting nurse return her call. Pt was advised we don't treat over the phone. Pt stated she understands she is wanting to discuss this to see if she would need to get labs sone prior to scheduling an appt and that she wanted to discuss this first since she lives 2 hours from the office. Please advise. Thanks TNP

## 2018-07-29 NOTE — Telephone Encounter (Signed)
Pt advised that she would have to make an appointment before Dr Sherrie Mustache could order blood work.  She agreed to schedule an appointment but she wanted to speak with you first to find out how much her bill was and what she would have to pay to come in.     Thanks,   -Vernona Rieger

## 2018-08-03 NOTE — Telephone Encounter (Signed)
I spoke to patient regarding her bill. cbe

## 2018-08-07 ENCOUNTER — Other Ambulatory Visit: Payer: Self-pay | Admitting: Family Medicine

## 2018-08-07 DIAGNOSIS — F329 Major depressive disorder, single episode, unspecified: Secondary | ICD-10-CM

## 2018-08-07 DIAGNOSIS — F32A Depression, unspecified: Secondary | ICD-10-CM

## 2018-08-07 DIAGNOSIS — G43109 Migraine with aura, not intractable, without status migrainosus: Secondary | ICD-10-CM

## 2018-08-07 MED ORDER — DULOXETINE HCL 30 MG PO CPEP: 90.0000 mg | ORAL_CAPSULE | Freq: Every day | ORAL | 2 refills | Status: DC

## 2018-08-07 MED ORDER — TOPIRAMATE 25 MG PO TABS: 25.0000 mg | ORAL_TABLET | Freq: Two times a day (BID) | ORAL | 3 refills | Status: DC

## 2018-08-07 MED ORDER — OMEPRAZOLE 20 MG PO CPDR: 40.0000 mg | DELAYED_RELEASE_CAPSULE | Freq: Every day | ORAL | 3 refills | Status: DC

## 2018-08-07 NOTE — Telephone Encounter (Signed)
[facility] faxed refill request for the following medications:  topiramate (TOPAMAX) 25 MG tablet  Qty: 90 days  Last filled: 06/16/2018  Please advise.

## 2018-08-07 NOTE — Telephone Encounter (Signed)
[facility] faxed refill request for the following medications:  omeprazole (PRILOSEC) 40 MG capsule  Qty: 90 days  Last filled: 06/16/2018  DULoxetine (CYMBALTA) 30 MG capsule   Qty: 90 days  Last filled:  06/16/2018  Please advise.

## 2018-08-26 ENCOUNTER — Other Ambulatory Visit: Payer: Self-pay

## 2018-08-26 NOTE — Telephone Encounter (Signed)
This medication was already approved and sent to pharmacy on 08/07/2018. Patient advised.

## 2018-10-21 ENCOUNTER — Other Ambulatory Visit: Payer: Self-pay | Admitting: Family Medicine

## 2018-10-21 NOTE — Telephone Encounter (Signed)
[facility] faxed refill request for the following medications:  dulera 200 MCG/5 MCG inhale  Qty: 90 days  Date written: 12/04/2017  Last filled: 09/10/2018  Please advise.

## 2018-10-22 MED ORDER — MOMETASONE FURO-FORMOTEROL FUM 200-5 MCG/ACT IN AERO: 2.0000 | INHALATION_SPRAY | Freq: Two times a day (BID) | RESPIRATORY_TRACT | 2 refills | Status: DC

## 2018-11-04 ENCOUNTER — Other Ambulatory Visit: Payer: Self-pay | Admitting: Family Medicine

## 2018-11-04 DIAGNOSIS — I10 Essential (primary) hypertension: Secondary | ICD-10-CM

## 2018-11-04 MED ORDER — AMLODIPINE BESYLATE 5 MG PO TABS: 5.0000 mg | ORAL_TABLET | Freq: Every day | ORAL | 3 refills | Status: DC

## 2018-11-04 NOTE — Telephone Encounter (Signed)
Pt needing her refill on: amLODipine (NORVASC) 5 MG tablet  Please fill at:  [facility], [place] - [address] [phone] (Phone) [phone] (Fax)   Thanks, [employer]

## 2018-12-03 ENCOUNTER — Other Ambulatory Visit: Payer: Self-pay

## 2018-12-03 DIAGNOSIS — I10 Essential (primary) hypertension: Secondary | ICD-10-CM

## 2018-12-03 MED ORDER — ATENOLOL 50 MG PO TABS: 50.0000 mg | ORAL_TABLET | Freq: Two times a day (BID) | ORAL | 0 refills | Status: DC

## 2018-12-03 NOTE — Telephone Encounter (Signed)
Patient request a refill for a 90 day supply (qty 180). [facility] [place])

## 2019-03-25 ENCOUNTER — Telehealth: Payer: Self-pay | Admitting: Family Medicine

## 2019-03-25 DIAGNOSIS — F411 Generalized anxiety disorder: Secondary | ICD-10-CM

## 2019-03-25 MED ORDER — ALPRAZOLAM 2 MG PO TABS: 2.0000 mg | ORAL_TABLET | Freq: Three times a day (TID) | ORAL | 3 refills | Status: DC | PRN

## 2019-03-25 NOTE — Telephone Encounter (Signed)
Pt needing a refill on: alprazolam Duanne Moron) 2 MG tablet  Please fill at: [place], [place] - [address] [phone] (Phone) [phone] (Fax)   Thanks, [facility]

## 2019-04-09 ENCOUNTER — Telehealth: Payer: Self-pay | Admitting: Family Medicine

## 2019-04-09 NOTE — Telephone Encounter (Signed)
Pt currently doesn't have insurance.  However, she does have [facility]. Pt wants to have an order for bloodwork to be done.  She wants to take it to [facility] and the results to be sent to Dr. Caryn Section.  She feels her broken leg from July 2019 is not healing.  She has had 2 surgeries, one in July 2019 and March 2020 as well.  She has also experienced seizures that started in Nov of 2019.   She will have times of having them, and they will come and go since Nov. 2019.  Please advise.  Thanks, [employer]

## 2019-04-12 NOTE — Telephone Encounter (Signed)
Patient states that she understands that you are unable to have the labs ordered through [facility], but now she is requesting lab orders to take to [facility]. Again patient states that she currently does not have any insurance coverage to come in for an office visit.

## 2019-04-12 NOTE — Telephone Encounter (Signed)
She is due for lipids, met c, and cbc for hypertension. can order those for her

## 2019-04-12 NOTE — Telephone Encounter (Signed)
We are not affiliated with [facility] and cannot order anything to be done there. She needs to contact [facility] about all this.

## 2019-04-12 NOTE — Telephone Encounter (Signed)
Pt has called back and would like someone to return her call  CB#  [phone]  Thanks teri

## 2019-04-13 ENCOUNTER — Other Ambulatory Visit: Payer: Self-pay

## 2019-04-13 DIAGNOSIS — I10 Essential (primary) hypertension: Secondary | ICD-10-CM

## 2019-04-13 NOTE — Telephone Encounter (Signed)
Lab orders printed and mailed to patient. Notified by phone they are on the way.

## 2019-04-30 ENCOUNTER — Other Ambulatory Visit: Payer: Self-pay

## 2019-04-30 ENCOUNTER — Encounter: Payer: Self-pay | Admitting: Family Medicine

## 2019-04-30 ENCOUNTER — Ambulatory Visit (INDEPENDENT_AMBULATORY_CARE_PROVIDER_SITE_OTHER): Payer: Self-pay | Admitting: Family Medicine

## 2019-04-30 DIAGNOSIS — F32A Depression, unspecified: Secondary | ICD-10-CM

## 2019-04-30 DIAGNOSIS — F329 Major depressive disorder, single episode, unspecified: Secondary | ICD-10-CM

## 2019-04-30 DIAGNOSIS — I10 Essential (primary) hypertension: Secondary | ICD-10-CM

## 2019-04-30 MED ORDER — ATENOLOL 50 MG PO TABS: 50.0000 mg | ORAL_TABLET | Freq: Two times a day (BID) | ORAL | 3 refills | Status: DC

## 2019-04-30 MED ORDER — MIRTAZAPINE 15 MG PO TABS: 15.0000 mg | ORAL_TABLET | Freq: Every day | ORAL | 3 refills | Status: DC

## 2019-04-30 NOTE — Progress Notes (Signed)
Patient: Marilyn Brown Female    DOB: [birth year]   48 y.o.   MRN: [id number] Visit Date: 04/30/2019  Today's Provider: Mila Merryonald Fisher, MD   No chief complaint on file.  Subjective:     HPI   Virtual Visit via Video Note  I connected with Marilyn Brown on 04/30/19 at  1:40 PM EDT by a video enabled telemedicine application and verified that I am speaking with the correct person using two identifiers.  Location: Patient: home Provider[facility].    I discussed the limitations of evaluation and management by telemedicine and the availability of in person appointments. The patient expressed understanding and agreed to proceed.     Hypertension, follow-up:  BP Readings from Last 3 Encounters:  11/03/17 120/70  12/06/16 (!) 142/82  08/16/15 (!) 156/76    She was last seen for hypertension 17 months ago.  BP at that visit was 120/70. Management since that visit includes no changes. She reports excellent compliance with treatment. She is not having side effects.  She is not exercising. She is adherent to low salt diet.  Secondary to leg fracture.    Outside blood pressures are rarely being checked.  Cardiovascular risk factors include hypertension.  Use of agents associated with hypertension: none.     Weight trend: stable Wt Readings from Last 3 Encounters:  11/03/17 138 lb (62.6 kg)  12/06/16 146 lb (66.2 kg)  08/15/15 145 lb (65.8 kg)    Current diet: in general, a "healthy" diet    ------------------------------------------------------------------------  She continues on mirtazapine for depression which she needs refill for .   Allergies  Allergen Reactions  . Triamcinolone Swelling  . Kenalog [Triamcinolone Acetonide] Swelling  . Prednisone Anxiety    Patient had swelling      Current Outpatient Medications:  .  alprazolam (XANAX) 2 MG tablet, Take 1 tablet (2 mg total) by mouth every 8 (eight) hours as needed., Disp:  90 tablet, Rfl: 3 .  amLODipine (NORVASC) 5 MG tablet, Take 1 tablet (5 mg total) by mouth daily., Disp: 90 tablet, Rfl: 3 .  atenolol (TENORMIN) 50 MG tablet, Take 1 tablet (50 mg total) by mouth 2 (two) times daily., Disp: 180 tablet, Rfl: 0 .  baclofen (LIORESAL) 10 MG tablet, Take 20 mg by mouth 3 (three) times daily. , Disp: , Rfl:  .  DULoxetine (CYMBALTA) 30 MG capsule, Take 3 capsules (90 mg total) by mouth daily., Disp: 270 capsule, Rfl: 2 .  gabapentin (NEURONTIN) 300 MG capsule, Take two tablets three times daily, and three tablets at bedtime, Disp: 1 capsule, Rfl: 0 .  mirtazapine (REMERON) 15 MG tablet, Take 1 tablet (15 mg total) by mouth at bedtime., Disp: 30 tablet, Rfl: 5 .  mometasone-formoterol (DULERA) 200-5 MCG/ACT AERO, Inhale 2 puffs into the lungs 2 (two) times daily., Disp: 3 Inhaler, Rfl: 2 .  omeprazole (PRILOSEC) 20 MG capsule, Take 2 capsules (40 mg total) by mouth daily., Disp: 180 capsule, Rfl: 3 .  topiramate (TOPAMAX) 25 MG tablet, Take 1 tablet (25 mg total) by mouth 2 (two) times daily., Disp: 180 tablet, Rfl: 3  Review of Systems  Constitutional: Negative.   Respiratory: Negative.   Cardiovascular: Negative.   Gastrointestinal: Negative.   Neurological: Negative for dizziness, light-headedness and headaches.    Social History   Tobacco Use  . Smoking status: Current Every Day Smoker    Packs/day: 0.50    Years: 22.00  Pack years: 11.00  . Smokeless tobacco: Never Used  . Tobacco comment: smokes <1 pack of cigarettes per day, since ago   Substance Use Topics  . Alcohol use: Yes    Alcohol/week: 0.0 standard drinks    Comment: moderate alcohol use      Objective:   There were no vitals taken for this visit.    Physical Exam  General appearance: alert, well developed, well nourished, cooperative and in no distress Head: Normocephalic, without obvious abnormality, atraumatic Respiratory: Respirations even and unlabored, normal respiratory  rate Extremities: No gross deformities Skin: Skin color, texture, turgor normal. No rashes seen  Psych: Appropriate mood and affect. Neurologic: Mental status: Alert, oriented to person, place, and time, thought content appropriate.       Assessment & Plan      1. Essential hypertension Well controlled.  Tolerating current medications. refill- atenolol (TENORMIN) 50 MG tablet; Take 1 tablet (50 mg total) by mouth 2 (two) times daily.  Dispense: 180 tablet; Refill: 3  2. Depression, unspecified depression type refill - mirtazapine (REMERON) 15 MG tablet; Take 1 tablet (15 mg total) by mouth at bedtime.  Dispense: 90 tablet; Refill: 3   - Cholecalciferol (QC VITAMIN D3) 50 MCG (2000 UT) TABS; Take by mouth. - PREMARIN 0.3 MG tablet; Take 0.3 mg by mouth daily.   Follow Up Instructions:    I discussed the assessment and treatment plan with the patient. The patient was provided an opportunity to ask questions and all were answered. The patient agreed with the plan and demonstrated an understanding of the instructions.   The patient was advised to call back or seek an in-person evaluation if the symptoms worsen or if the condition fails to improve as anticipated.  I provided 12 minutes of non-face-to-face time during this encounter.     Lelon Huh, MD  [facility]

## 2019-05-12 ENCOUNTER — Other Ambulatory Visit: Payer: Self-pay | Admitting: Family Medicine

## 2019-05-12 NOTE — Telephone Encounter (Signed)
I don't see this on her med list.  Please advise.   Thanks,   -Laura  

## 2019-05-12 NOTE — Telephone Encounter (Signed)
[facility] faxed refill request for the following medications:  DULoxetine (CYMBALTA) 30 MG capsule [   Please advise.

## 2019-05-13 NOTE — Telephone Encounter (Signed)
Spoke with Ms. Lanza, she states she did stop Duloxetine.  Please disregard this refill request.    Thanks,   -Mickel Baas

## 2019-05-13 NOTE — Telephone Encounter (Signed)
Please check with patient about this medication. She had an e-visit on 7-31 and I thought she had stopped taking it at that time.

## 2019-05-17 NOTE — Patient Instructions (Signed)
.   Please review the attached list of medications and notify my office if there are any errors.   . Please bring all of your medications to every appointment so we can make sure that our medication list is the same as yours.   . We will have flu vaccines available after Labor Day. Please go to your pharmacy or call the office in early September to schedule you flu shot.   

## 2019-06-02 ENCOUNTER — Other Ambulatory Visit: Payer: Self-pay | Admitting: Family Medicine

## 2019-06-02 DIAGNOSIS — G43109 Migraine with aura, not intractable, without status migrainosus: Secondary | ICD-10-CM

## 2019-06-02 DIAGNOSIS — I10 Essential (primary) hypertension: Secondary | ICD-10-CM

## 2019-06-02 MED ORDER — OMEPRAZOLE 20 MG PO CPDR: 40.0000 mg | DELAYED_RELEASE_CAPSULE | Freq: Every day | ORAL | 4 refills | Status: DC

## 2019-06-02 MED ORDER — AMLODIPINE BESYLATE 5 MG PO TABS: 5.0000 mg | ORAL_TABLET | Freq: Every day | ORAL | 4 refills | Status: DC

## 2019-06-02 MED ORDER — TOPIRAMATE 25 MG PO TABS: 25.0000 mg | ORAL_TABLET | Freq: Two times a day (BID) | ORAL | 4 refills | Status: DC

## 2019-06-02 MED ORDER — DULERA 200-5 MCG/ACT IN AERO: 2.0000 | INHALATION_SPRAY | Freq: Two times a day (BID) | RESPIRATORY_TRACT | 3 refills | Status: DC

## 2019-06-02 NOTE — Telephone Encounter (Signed)
Pt needs refills on  Topiramate 25 mg  Amlodipine 5 mg  Prilosec 20 mg  Dulera 200-5   [facility] to  90 day supply  CB# [phone]  Con [place]

## 2019-07-27 ENCOUNTER — Telehealth: Payer: Self-pay | Admitting: Family Medicine

## 2019-07-27 ENCOUNTER — Encounter: Payer: Self-pay | Admitting: Family Medicine

## 2019-07-27 ENCOUNTER — Telehealth (INDEPENDENT_AMBULATORY_CARE_PROVIDER_SITE_OTHER): Payer: Self-pay | Admitting: Family Medicine

## 2019-07-27 VITALS — BP 132/76 | Temp 98.6°F

## 2019-07-27 DIAGNOSIS — M5126 Other intervertebral disc displacement, lumbar region: Secondary | ICD-10-CM

## 2019-07-27 DIAGNOSIS — G8921 Chronic pain due to trauma: Secondary | ICD-10-CM

## 2019-07-27 DIAGNOSIS — F419 Anxiety disorder, unspecified: Secondary | ICD-10-CM

## 2019-07-27 MED ORDER — CITALOPRAM HYDROBROMIDE 10 MG PO TABS: ORAL_TABLET | ORAL | 0 refills | Status: DC

## 2019-07-27 NOTE — Telephone Encounter (Signed)
Pt called saying pain management agreed with her taking the buspirone.   She would like to have a nurse call back [phone]   [facility]

## 2019-07-27 NOTE — Telephone Encounter (Signed)
Has been sent to pharmacy since call

## 2019-07-27 NOTE — Telephone Encounter (Signed)
Jules Schick, CMA routed conversation to You 46 minutes ago (3:50 PM)    Greggory Keen D routed conversation to [facility] Nurse 1 hour ago (2:45 PM)    Greggory Keen D 1 hour ago (2:45 PM)     Pt called saying pain management agreed with her taking the buspirone.   She would like to have a nurse call back [phone]   [facility]      Documentation

## 2019-07-27 NOTE — Telephone Encounter (Signed)
Patient says "yes," she wants to take Buspirone instead of Citalopram.

## 2019-07-27 NOTE — Telephone Encounter (Signed)
Patient advised.

## 2019-07-27 NOTE — Progress Notes (Signed)
Patient: Marilyn Brown Female    DOB: 1971/05/27   48 y.o.   MRN: [id number] Visit Date: 07/27/2019  Today's Provider: Lelon Huh, MD   Chief Complaint  Patient presents with  . Anxiety   Subjective:     HPI  Virtual Visit via Video Note  I connected with Marilyn Brown on 07/27/19 at  8:00 AM EDT by a video enabled telemedicine application and verified that I am speaking with the correct person using two identifiers.   I discussed the limitations of evaluation and management by telemedicine and the availability of in person appointments. The patient expressed understanding and agreed to proceed.    Follow up for Depression/ Anxiety:  The patient was last seen for this 3 months ago. Changes made at last visit include none.  She reports good compliance with treatment. Patient has only needed to take 1/2 tablet daily of Xanax. She states pain management due to degenerative disk disease wants her to come off this medication due to interaction with opiods.  She stopped alprazolam cold [place] last year and had seizure. Is currently just taking 1/2 of alprazolam daily in the afternoon. Has taken Cymbalta in the past for other reasons and she states it did not help. Has also taken sertraline from around 2011 through 2015, but was discontinued for unclear reasons. She is concerned if she has to stop alprazolam that her anxiety will get much worse.  She feels that condition is Improved. She is not having side effects.   ------------------------------------------------------------------------------------  Allergies  Allergen Reactions  . Triamcinolone Swelling  . Kenalog [Triamcinolone Acetonide] Swelling  . Prednisone Anxiety    Patient had swelling      Current Outpatient Medications:  .  alprazolam (XANAX) 2 MG tablet, Take 1 tablet (2 mg total) by mouth every 8 (eight) hours as needed., Disp: 90 tablet, Rfl: 3 .  amLODipine (NORVASC) 5 MG tablet, Take 1 tablet  (5 mg total) by mouth daily., Disp: 90 tablet, Rfl: 4 .  atenolol (TENORMIN) 50 MG tablet, Take 1 tablet (50 mg total) by mouth 2 (two) times daily., Disp: 180 tablet, Rfl: 3 .  baclofen (LIORESAL) 10 MG tablet, Take 20 mg by mouth 3 (three) times daily. , Disp: , Rfl:  .  Cholecalciferol (QC VITAMIN D3) 50 MCG (2000 UT) TABS, Take by mouth., Disp: , Rfl:  .  gabapentin (NEURONTIN) 300 MG capsule, Take two tablets three times daily, and three tablets at bedtime (Patient taking differently: 300 mg 4 (four) times daily. Takes 4 cap every morning, 3 cap in the afternoon, then 3 capsules at night), Disp: 1 capsule, Rfl: 0 .  mirtazapine (REMERON) 15 MG tablet, Take 1 tablet (15 mg total) by mouth at bedtime., Disp: 90 tablet, Rfl: 3 .  mometasone-formoterol (DULERA) 200-5 MCG/ACT AERO, Inhale 2 puffs into the lungs 2 (two) times daily., Disp: 13 g, Rfl: 3 .  omeprazole (PRILOSEC) 20 MG capsule, Take 2 capsules (40 mg total) by mouth daily., Disp: 180 capsule, Rfl: 4 .  PREMARIN 0.3 MG tablet, Take 0.3 mg by mouth daily., Disp: , Rfl:  .  topiramate (TOPAMAX) 25 MG tablet, Take 1 tablet (25 mg total) by mouth 2 (two) times daily., Disp: 180 tablet, Rfl: 4  Review of Systems  Constitutional: Negative for appetite change, chills, fatigue and fever.  Respiratory: Negative for chest tightness and shortness of breath.   Cardiovascular: Negative for chest pain and palpitations.  Gastrointestinal: Negative  for abdominal pain, nausea and vomiting.  Neurological: Negative for dizziness and weakness.    Social History   Tobacco Use  . Smoking status: Former Smoker    Packs/day: 0.50    Years: 22.00    Pack years: 11.00    Quit date: 04/03/2019    Years since quitting: 0.3  . Smokeless tobacco: Never Used  . Tobacco comment: smokes <1 pack of cigarettes per day, since ago   Substance Use Topics  . Alcohol use: Yes    Alcohol/week: 0.0 standard drinks    Comment: moderate alcohol use       Objective:   BP 132/76   Temp 98.6 F (37 C)  Vitals:   07/27/19 0754  BP: 132/76  Temp: 98.6 F (37 C)  There is no height or weight on file to calculate BMI.   Physical Exam  Awake, alert, oriented x 3 in NAD     Assessment & Plan    1. Anxiety Wean alprazolam as per PI and start - citalopram (CELEXA) 10 MG tablet; Take 1 tablet daily for 7 days, then increase to 2 tablets daily  Dispense: 40 tablet; Refill: 0  2. Displacement of lumbar intervertebral disc without myelopathy She anticipates starting oral pain medication from pain clinic and will work on weaning alprazolam before hand  3. Chronic pain due to trauma      Mila Merry, MD  [facility]

## 2019-07-27 NOTE — Telephone Encounter (Signed)
I had sent prescription for citalopram to her pharmacy. Does she want to change it to buspirone instead?

## 2019-07-27 NOTE — Telephone Encounter (Signed)
Pt calling about her medication that was supposed to be called in from the last visit to: [place], [place] - [place] [phone] (Phone) [phone] (Fax)   She has checked and it has not be called in.  Pt doesn't remember name because a lot of medications were discuss at visit.  Please advise.  Thanks, [employer]

## 2019-07-27 NOTE — Patient Instructions (Addendum)
.   Start citalopram 10mg  by taking one tablet every day for 1 week  . After 1 week, increase citalopram to 2 tablets every day and reduce alprazolam 2mg  tablets to 1/4 tablet daily. If you need to, you can alternate taking 1/2 tablet and 1/4 tablet a day at first   We will contact you in about 3 weeks and plan on reducing alprazolam to 0.25mg  tablets around that time.

## 2019-07-28 MED ORDER — BUSPIRONE HCL 15 MG PO TABS: ORAL_TABLET | ORAL | 0 refills | Status: DC

## 2019-08-15 ENCOUNTER — Telehealth: Payer: Self-pay | Admitting: Family Medicine

## 2019-08-15 DIAGNOSIS — F411 Generalized anxiety disorder: Secondary | ICD-10-CM

## 2019-08-15 MED ORDER — ALPRAZOLAM 0.25 MG PO TABS: 0.2500 mg | ORAL_TABLET | Freq: Four times a day (QID) | ORAL | 0 refills | Status: DC | PRN

## 2019-08-15 NOTE — Telephone Encounter (Signed)
Please advise patient have sent in prescription to reduce alprazolam to 0.25mg  tablets.

## 2019-08-15 NOTE — Telephone Encounter (Signed)
Please advise patient have sent prescription for 0.25mg  [facility] to [facility] in [place] so she can continue to wean off of it. Please if she is doing OK with buspirone prescription that was sent in earlier this month or if she feels she needs to up on the dose.

## 2019-08-16 NOTE — Telephone Encounter (Signed)
LMTCB, CRM created. Okay for nurse triage to discuss.

## 2019-08-17 ENCOUNTER — Telehealth: Payer: Self-pay | Admitting: Family Medicine

## 2019-08-17 MED ORDER — BUSPIRONE HCL 30 MG PO TABS: ORAL_TABLET | ORAL | 1 refills | Status: DC

## 2019-08-17 NOTE — Telephone Encounter (Signed)
Advise patient have sent prescription for 30mg  buspirone with instructions on prescription for titrating dose.

## 2019-08-17 NOTE — Addendum Note (Signed)
Addended by: Lelon Huh E on: 08/17/2019 11:40 AM   Modules accepted: Orders

## 2019-08-17 NOTE — Telephone Encounter (Signed)
Pt calling with questions regarding Buspirone prescription. States she has already been on the 15mg  for 6 days. New prescription reads 15 mg for 6 days, followed by 30 mg.   Pt questioning if she is to just begin with the 30 mg dose.  Please advise:  CB# [phone]

## 2019-08-17 NOTE — Telephone Encounter (Signed)
Patient advised as below. Patient states she has already been taking 15mg  of Buspirone twice daily. She wanted to know if she could increase to taking 1 full tablet of 30mg  Buspirone twice daily. I consulted with Dr. Caryn Section and was advised that patient should not increased to taking 30mg  twice daily due to possible dizziness. Per Dr. Caryn Section, patient should start out taking 1/2 tablet of Buspirone 30mg  in the morning, and then take a full tablet of Buspirone 30mg  in the evening. Patient should do this for the next 3-4 days, then can titrate to 1 full tablet of 30mg  twice daily. Patient was advised and verbalized understanding.

## 2019-08-17 NOTE — Telephone Encounter (Signed)
From [facility], please review 

## 2019-08-17 NOTE — Telephone Encounter (Signed)
Pt given information per Dr Caryn Section, " her anxiety level has gotten higher, and she feels like a higher dose of buspirone; she would like to have the new prescription sent to the [facility] in [place].

## 2019-08-18 NOTE — Telephone Encounter (Signed)
From [facility], please review 

## 2019-08-18 NOTE — Telephone Encounter (Signed)
I already spoke with patient about this last night. See separate phone note.

## 2019-08-19 ENCOUNTER — Telehealth: Payer: Self-pay | Admitting: Family Medicine

## 2019-08-19 NOTE — Telephone Encounter (Signed)
From [facility] 

## 2019-08-19 NOTE — Telephone Encounter (Signed)
Pt requesting CB from Dr. Maralyn Sago assistant. She wants to discuss how to safely start tapering off xanax. Please advise.

## 2019-08-20 NOTE — Telephone Encounter (Signed)
Patient advised.

## 2019-08-20 NOTE — Telephone Encounter (Signed)
We changed her prescription from 0.5mg  to 0.25mg  when refilled earlier this week. She should be able to take 1/2 tablet of the 25 mg for 1-2 weeks. Then she should be able to stop without any further weaning

## 2019-08-24 ENCOUNTER — Encounter: Payer: Self-pay | Admitting: Family Medicine

## 2019-08-24 DIAGNOSIS — I10 Essential (primary) hypertension: Secondary | ICD-10-CM

## 2019-08-25 ENCOUNTER — Telehealth: Payer: Self-pay

## 2019-08-25 DIAGNOSIS — I10 Essential (primary) hypertension: Secondary | ICD-10-CM

## 2019-08-25 MED ORDER — DULERA 200-5 MCG/ACT IN AERO: 2.0000 | INHALATION_SPRAY | Freq: Two times a day (BID) | RESPIRATORY_TRACT | 3 refills | Status: DC

## 2019-08-25 MED ORDER — AMLODIPINE BESYLATE 5 MG PO TABS: 5.0000 mg | ORAL_TABLET | Freq: Every day | ORAL | 4 refills | Status: DC

## 2019-08-25 MED ORDER — OMEPRAZOLE 20 MG PO CPDR: 40.0000 mg | DELAYED_RELEASE_CAPSULE | Freq: Every day | ORAL | 4 refills | Status: DC

## 2019-08-25 NOTE — Telephone Encounter (Signed)
Opened in error

## 2019-09-29 ENCOUNTER — Telehealth: Payer: Self-pay | Admitting: Family Medicine

## 2019-09-29 NOTE — Telephone Encounter (Signed)
Pt needs a refill for busPIRone (BUSPAR) 30 MG tablet But would like the dose changed to 15Mg  / Pt states that the 30Mg  is too much and she only takes half.  Send to  [place], [place] Phone:  [phone]  Fax:  [phone]      Pt also asked if Xanax can be taken off her med list, she states that she no longer takes it.   Pt also wanted to see what Dr. Caryn Section can advise about her hand aches and pain/ pt doesn't know if it is due to her having Raynaud's disease or if it is arthritis/ Pt hands have been aching for over a month and she states that Topamax is not helping it at all./please advise

## 2019-09-29 NOTE — Telephone Encounter (Signed)
From [facility] 

## 2019-10-01 ENCOUNTER — Encounter: Payer: Self-pay | Admitting: Family Medicine

## 2019-10-04 MED ORDER — BUSPIRONE HCL 15 MG PO TABS: 15.0000 mg | ORAL_TABLET | Freq: Two times a day (BID) | ORAL | 3 refills | Status: DC

## 2019-10-05 ENCOUNTER — Encounter: Payer: Self-pay | Admitting: Family Medicine

## 2019-10-05 ENCOUNTER — Telehealth: Payer: Self-pay | Admitting: Family Medicine

## 2019-10-05 DIAGNOSIS — F32A Depression, unspecified: Secondary | ICD-10-CM

## 2019-10-05 DIAGNOSIS — M25542 Pain in joints of left hand: Secondary | ICD-10-CM

## 2019-10-05 DIAGNOSIS — M25541 Pain in joints of right hand: Secondary | ICD-10-CM

## 2019-10-05 DIAGNOSIS — F329 Major depressive disorder, single episode, unspecified: Secondary | ICD-10-CM

## 2019-10-05 DIAGNOSIS — I7300 Raynaud's syndrome without gangrene: Secondary | ICD-10-CM

## 2019-10-05 MED ORDER — BUSPIRONE HCL 15 MG PO TABS: 15.0000 mg | ORAL_TABLET | Freq: Two times a day (BID) | ORAL | 3 refills | Status: DC

## 2019-10-05 NOTE — Telephone Encounter (Signed)
Pt called and is needing her buspirone sent to new pharmacy. Please advise.     [facility] [address], [place] - [address]  [address] [place] [address]  Phone: [phone] Fax: [phone]  Not a 24 hour pharmacy; exact hours not known.

## 2019-10-05 NOTE — Telephone Encounter (Signed)
Buspar was sent to the wrong pharmacy yesterday. Patient would like RX resent to [facility] pharmacy.

## 2019-10-15 MED ORDER — MIRTAZAPINE 30 MG PO TABS: 30.0000 mg | ORAL_TABLET | Freq: Every day | ORAL | 6 refills | Status: DC

## 2019-10-27 ENCOUNTER — Encounter: Payer: Self-pay | Admitting: Family Medicine

## 2019-12-15 ENCOUNTER — Encounter: Payer: Self-pay | Admitting: Family Medicine

## 2019-12-18 ENCOUNTER — Encounter: Payer: Self-pay | Admitting: Family Medicine

## 2019-12-20 ENCOUNTER — Telehealth: Payer: Self-pay | Admitting: Family Medicine

## 2019-12-20 NOTE — Telephone Encounter (Signed)
FW: Non-Urgent Medical Question Received: Today Message Contents  Sherre Poot, CMA  Malva Limes, MD      Previous Messages  Non-Urgent Medical Question  Sherre Poot, CMA routed conversation to You 11 hours ago (7:57 AM)  Broadus John, [place] "Jamine Wingate"  You 2 days ago   Dr. Sherrie Mustache  I sent you a message last week in regards to my fingernails, I have not heard anything from you or a member of your staff Also can you please send that referral back over to [facility] rheumatologist? Maybe they can help with the Reynaud's/ fingernail issue ?? Alvera Novel

## 2019-12-20 NOTE — Telephone Encounter (Signed)
Can you contact this patient about the Rheumatology referral that was ordered in January. Referral notes states the [facility] rheumatology was going to contact the patient, so I have no idea why the patient is asking [place] to redo the referral. Thanks.

## 2019-12-21 ENCOUNTER — Encounter: Payer: Self-pay | Admitting: Family Medicine

## 2019-12-22 ENCOUNTER — Other Ambulatory Visit: Payer: Self-pay

## 2019-12-22 MED ORDER — DULERA 200-5 MCG/ACT IN AERO: 2.0000 | INHALATION_SPRAY | Freq: Two times a day (BID) | RESPIRATORY_TRACT | 3 refills | Status: DC

## 2019-12-22 NOTE — Telephone Encounter (Signed)
.  pcpn 

## 2019-12-24 ENCOUNTER — Telehealth: Payer: Self-pay

## 2019-12-24 ENCOUNTER — Encounter: Payer: Self-pay | Admitting: Family Medicine

## 2019-12-24 DIAGNOSIS — F419 Anxiety disorder, unspecified: Secondary | ICD-10-CM

## 2019-12-24 MED ORDER — ALBUTEROL SULFATE HFA 108 (90 BASE) MCG/ACT IN AERS: 2.0000 | INHALATION_SPRAY | Freq: Four times a day (QID) | RESPIRATORY_TRACT | 3 refills | Status: DC | PRN

## 2019-12-24 MED ORDER — BUSPIRONE HCL 15 MG PO TABS: 15.0000 mg | ORAL_TABLET | Freq: Two times a day (BID) | ORAL | 12 refills | Status: AC

## 2019-12-24 NOTE — Telephone Encounter (Signed)
opened in error

## 2020-01-06 NOTE — Telephone Encounter (Signed)
She needs more recent documentation for me to fax for referral. She has made an appointment with you so we will have this

## 2020-01-31 NOTE — Progress Notes (Signed)
Virtual telephone visit    Virtual Visit via Telephone Note   This visit type was conducted due to national recommendations for restrictions regarding the COVID-19 Pandemic (e.g. social distancing) in an effort to limit this patient's exposure and mitigate transmission in our community. Due to her co-morbid illnesses, this patient is at least at moderate risk for complications without adequate follow up. This format is felt to be most appropriate for this patient at this time. The patient did not have access to video technology or had technical difficulties with video requiring transitioning to audio format only (telephone). Physical exam was limited to content and character of the telephone converstion.    Patient location: home Provider location: [facility]   Visit Date: 02/01/2020  Today's healthcare provider: Mila Merry, MD   Chief Complaint  Patient presents with  . Arthralgia  . Raynaud's   Subjective    HPI Follow up for Arthralgia and Raynaud's Disease:   Patient is requesting a referral to Rheumatology.  She has never seen a specialist for these problems. She states she has been taking topiramate prescribed at [facility] and is has been very effective for Raynaud's.   She also reports that she has constant aching in the fingers of both hands and they are very sensitive to the touch. She has also noted that her fingernails are growing downward. They have not been swollen or red.  -----------------------------------------------------------------------------------------    Anxiety/ Panic attacks:  Patient reports that for the past month she has been more nervous and anxious to the point where she is having panic attacks. She is unable to fall asleep and stay asleep at night. She has been sleeping an average of 2 hours per night. She has been more irritable and doesn't "feel like herself". She has taking a couple doses of Xanax that she was prescribed in the past which helped  improve her Anxiety. She had been on scheduled doses of alprazolam at 2mg  three times daily for many years which was effective, but was weaned off last year due to interaction with pain medications prescribed from pain clinic. She states she is no longer taking opioids due to adverse reactions and ineffectiveness of those medications, and is not planning on returning to pain clinic.   She feels her anxiety is severe and Worse since last visit.  Symptoms: No chest pain No difficulty concentrating No dizziness Yes fatigue No feelings of losing control Yes insomnia Yes irritable No palpitations Yes panic attacks No racing thoughts No shortness of breath No sweating No tremors/shakes  GAD-7 Results GAD-7 Generalized Anxiety Disorder Screening Tool 02/01/2020  1. Feeling Nervous, Anxious, or on Edge 3  2. Not Being Able to Stop or Control Worrying 1  3. Worrying Too Much About Different Things 0  4. Trouble Relaxing 3  5. Being So Restless it's Hard To Sit Still 0  6. Becoming Easily Annoyed or Irritable 0  7. Feeling Afraid As If Something Awful Might Happen 0  Total GAD-7 Score 7  Difficulty At Work, Home, or Getting  Along With Others? Very difficult    PHQ-9 Scores PHQ9 SCORE ONLY 02/01/2020 11/03/2017 07/07/2015  Score 5 6 10     ---------------------------------------------------------------------------------------------------    Medications: Outpatient Medications Prior to Visit  Medication Sig  . albuterol (PROVENTIL HFA) 108 (90 Base) MCG/ACT inhaler Inhale 2 puffs into the lungs every 6 (six) hours as needed for wheezing or shortness of breath.  09/06/2015 amLODipine (NORVASC) 5 MG tablet Take 1 tablet (5  mg total) by mouth daily.  Marland Kitchen atenolol (TENORMIN) 50 MG tablet Take 1 tablet (50 mg total) by mouth 2 (two) times daily.  . baclofen (LIORESAL) 10 MG tablet Take 20 mg by mouth 3 (three) times daily.   . busPIRone (BUSPAR) 15 MG tablet Take 1 tablet (15 mg total) by mouth 2 (two)  times daily.  . Cholecalciferol (QC VITAMIN D3) 50 MCG (2000 UT) TABS Take by mouth.  . gabapentin (NEURONTIN) 300 MG capsule Take two tablets three times daily, and three tablets at bedtime (Patient taking differently: 300 mg 4 (four) times daily. Takes 4 cap every morning, 3 cap in the afternoon, then 3 capsules at night)  . mirtazapine (REMERON) 30 MG tablet Take 1 tablet (30 mg total) by mouth at bedtime.  . mometasone-formoterol (DULERA) 200-5 MCG/ACT AERO Inhale 2 puffs into the lungs 2 (two) times daily.  Marland Kitchen omeprazole (PRILOSEC) 20 MG capsule Take 2 capsules (40 mg total) by mouth daily.  Marland Kitchen PREMARIN 0.3 MG tablet Take 0.3 mg by mouth daily.  Marland Kitchen topiramate (TOPAMAX) 25 MG tablet Take 1 tablet (25 mg total) by mouth 2 (two) times daily.   No facility-administered medications prior to visit.    Review of Systems  Constitutional: Negative for appetite change, chills, fatigue and fever.  Respiratory: Negative for chest tightness and shortness of breath.   Cardiovascular: Negative for chest pain and palpitations.  Gastrointestinal: Negative for abdominal pain, nausea and vomiting.  Musculoskeletal: Positive for arthralgias.  Neurological: Negative for dizziness and weakness.  Psychiatric/Behavioral: Positive for sleep disturbance and suicidal ideas. Negative for confusion and self-injury. The patient is nervous/anxious.       Objective    There were no vitals taken for this visit.  Awake, alert, oriented x 3. In no apparent distress    Assessment & Plan     1. Raynaud's disease without gangrene She states topiramate has kept this very well controlled and requests refills.  - topiramate (TOPAMAX) 50 MG tablet; Take 1 tablet (50 mg total) by mouth 2 (two) times daily.  Dispense: 180 tablet; Refill: 4  2. Migraine with aura and without status migrainosus, not intractable  - topiramate (TOPAMAX) 50 MG tablet; Take 1 tablet (50 mg total) by mouth 2 (two) times daily.  Dispense: 180  tablet; Refill: 4  3. Panic disorder without agoraphobia Did well on Bz for many years, but off since last year when she was started on Opioids, which she has now discontinued. Will restart lower dose of - ALPRAZolam (XANAX) 0.5 MG tablet; Take 1 tablet (0.5 mg total) by mouth 3 (three) times daily as needed.  Dispense: 90 tablet; Refill: 5  And start low dose- PARoxetine (PAXIL) 10 MG tablet; Take 1 tablet (10 mg total) by mouth daily.  Dispense: 90 tablet; Refill: 3  4. Arthralgia of both hands Refer [facility] rheumatology   No follow-ups on file.    I discussed the assessment and treatment plan with the patient. The patient was provided an opportunity to ask questions and all were answered. The patient agreed with the plan and demonstrated an understanding of the instructions.   The patient was advised to call back or seek an in-person evaluation if the symptoms worsen or if the condition fails to improve as anticipated.  I provided 14 minutes of non-face-to-face time during this encounter.  The entirety of the information documented in the History of Present Illness, Review of Systems and Physical Exam were personally obtained by me. Portions of  this information were initially documented by the CMA and reviewed by me for thoroughness and accuracy.     Lelon Huh, MD [facility] [phone] (phone) [phone] (fax)  [place]

## 2020-02-01 ENCOUNTER — Encounter: Payer: Self-pay | Admitting: Family Medicine

## 2020-02-01 ENCOUNTER — Ambulatory Visit (INDEPENDENT_AMBULATORY_CARE_PROVIDER_SITE_OTHER): Payer: Medicaid Other | Admitting: Family Medicine

## 2020-02-01 DIAGNOSIS — G43109 Migraine with aura, not intractable, without status migrainosus: Secondary | ICD-10-CM

## 2020-02-01 DIAGNOSIS — M25541 Pain in joints of right hand: Secondary | ICD-10-CM

## 2020-02-01 DIAGNOSIS — F410 Panic disorder [episodic paroxysmal anxiety] without agoraphobia: Secondary | ICD-10-CM

## 2020-02-01 DIAGNOSIS — I7300 Raynaud's syndrome without gangrene: Secondary | ICD-10-CM

## 2020-02-01 DIAGNOSIS — M25542 Pain in joints of left hand: Secondary | ICD-10-CM

## 2020-02-01 MED ORDER — ALPRAZOLAM 0.5 MG PO TABS: 0.5000 mg | ORAL_TABLET | Freq: Three times a day (TID) | ORAL | 5 refills | Status: DC | PRN

## 2020-02-01 MED ORDER — PAROXETINE HCL 10 MG PO TABS: 10.0000 mg | ORAL_TABLET | Freq: Every day | ORAL | 3 refills | Status: DC

## 2020-02-01 MED ORDER — TOPIRAMATE 50 MG PO TABS: 50.0000 mg | ORAL_TABLET | Freq: Two times a day (BID) | ORAL | 4 refills | Status: DC

## 2020-02-01 NOTE — Patient Instructions (Signed)
.   Please review the attached list of medications and notify my office if there are any errors.   . Please bring all of your medications to every appointment so we can make sure that our medication list is the same as yours.   

## 2020-02-02 ENCOUNTER — Other Ambulatory Visit: Payer: Self-pay | Admitting: Family Medicine

## 2020-02-02 MED ORDER — FLUTICASONE-SALMETEROL 250-50 MCG/DOSE IN AEPB
1.0000 | INHALATION_SPRAY | Freq: Two times a day (BID) | RESPIRATORY_TRACT | 4 refills | Status: DC
Start: 2020-02-02 — End: 2021-06-25

## 2020-02-02 NOTE — Progress Notes (Signed)
Dulera not covered per fax from [facility]. Change to Advair.

## 2020-02-07 ENCOUNTER — Other Ambulatory Visit: Payer: Self-pay

## 2020-02-07 DIAGNOSIS — F410 Panic disorder [episodic paroxysmal anxiety] without agoraphobia: Secondary | ICD-10-CM

## 2020-02-07 NOTE — Telephone Encounter (Addendum)
I called and spoke with patient. She wants the Alprazolam sent to [facility] in [place]. I advised patient that this medication was already sent to that pharmacy on 02/01/2020. I confirmed with pharmacy that it was received and ready for pick up.  She also wanted to know why she received a prescription for Advair in the mail. I informed her that [facility] sent our office notification on 02/02/2020 that [facility] wasn't covered, so Dr. Sherrie Mustache changed the medication to Advair. Patient verbalized understanding. She wanted to let Dr. Sherrie Mustache know that she doesn't use the [facility] very often and she has several Dulera inhalers at home that she hasn't even used yet. She says her breathing is not that bad to where she has to use an inhaler every day. She states her breathing is stable and she only needs to use the Proventil inhaler once a week.

## 2020-02-07 NOTE — Telephone Encounter (Signed)
Please clarify, she [facility] some meds [facility] and some from [facility]. Does she want alprazolam sent to [facility] or [facility]?

## 2020-02-07 NOTE — Telephone Encounter (Signed)
Spoke to patient and she said that the Alprazolam is not the problem, she needs to know why the advair diskus was sent to her. Patient would like a callback from [place].

## 2020-02-07 NOTE — Telephone Encounter (Signed)
Copied from CRM [id number]. Topic: General - Inquiry >> Feb 07, 2020 12:04 PM Deborha Payment wrote: Reason for CRM: Patient is requesting a call back from nurse regarding a medication she got in the mail and is confused on wondering if there was an error.  Call back [phone]

## 2020-02-07 NOTE — Telephone Encounter (Signed)
[facility] [place], [place] - [address], [place] [address] Phone:  [phone]  Fax:  [phone]     Delivered Fluticasone-Salmeterol (ADVAIR DISKUS) 250-50 MCG/DOSE AEPB, patient states this medication is for  COPD. Patient states PCP was going to prescribed anxiety medication, please advised

## 2020-02-07 NOTE — Telephone Encounter (Signed)
Need more information. Tried calling patient. Left message to call back. Please find out more detail like what medication she received in the mail that she confused about and why she is confused or what question she has. OK for [facility] to advise and collect more information.

## 2020-02-23 ENCOUNTER — Telehealth: Payer: Self-pay | Admitting: Family Medicine

## 2020-02-23 DIAGNOSIS — I7300 Raynaud's syndrome without gangrene: Secondary | ICD-10-CM

## 2020-02-23 DIAGNOSIS — G43109 Migraine with aura, not intractable, without status migrainosus: Secondary | ICD-10-CM

## 2020-02-23 NOTE — Telephone Encounter (Signed)
topiramate (TOPAMAX) 50 MG tablet     Patient states that prescribing physcian before Dr. Sherrie Mustache had her on 100 mg x2 a day. She feels like that dosage worked better for her and inquired if it could be switched back to that dosage.

## 2020-02-24 ENCOUNTER — Encounter: Payer: Self-pay | Admitting: Family Medicine

## 2020-02-24 MED ORDER — TOPIRAMATE 100 MG PO TABS: 100.0000 mg | ORAL_TABLET | Freq: Two times a day (BID) | ORAL | 1 refills | Status: DC

## 2020-02-24 NOTE — Telephone Encounter (Signed)
Have sent prescription for 100mg  to [facility]

## 2020-02-24 NOTE — Telephone Encounter (Signed)
Patient is checking status on medication refill. Call back [phone]

## 2020-02-25 NOTE — Telephone Encounter (Signed)
Attempted to contact patient, no answer left a voicemail advising patient that RX has been sent to pharmacy

## 2020-03-16 ENCOUNTER — Encounter: Payer: Self-pay | Admitting: Family Medicine

## 2020-03-17 ENCOUNTER — Telehealth: Payer: Self-pay

## 2020-03-17 MED ORDER — ALBUTEROL SULFATE HFA 108 (90 BASE) MCG/ACT IN AERS: 2.0000 | INHALATION_SPRAY | Freq: Four times a day (QID) | RESPIRATORY_TRACT | 3 refills | Status: DC | PRN

## 2020-03-17 NOTE — Telephone Encounter (Signed)
RX sent to [facility] pharmacy

## 2020-04-23 ENCOUNTER — Encounter: Payer: Self-pay | Admitting: Family Medicine

## 2020-04-23 DIAGNOSIS — I7300 Raynaud's syndrome without gangrene: Secondary | ICD-10-CM

## 2020-04-23 DIAGNOSIS — G43109 Migraine with aura, not intractable, without status migrainosus: Secondary | ICD-10-CM

## 2020-04-23 DIAGNOSIS — I10 Essential (primary) hypertension: Secondary | ICD-10-CM

## 2020-04-26 MED ORDER — ATENOLOL 50 MG PO TABS: 50.0000 mg | ORAL_TABLET | Freq: Two times a day (BID) | ORAL | 4 refills | Status: DC

## 2020-04-26 MED ORDER — AMLODIPINE BESYLATE 5 MG PO TABS: 5.0000 mg | ORAL_TABLET | Freq: Every day | ORAL | 4 refills | Status: DC

## 2020-04-26 MED ORDER — ALBUTEROL SULFATE HFA 108 (90 BASE) MCG/ACT IN AERS: 2.0000 | INHALATION_SPRAY | Freq: Four times a day (QID) | RESPIRATORY_TRACT | 3 refills | Status: DC | PRN

## 2020-04-26 MED ORDER — TOPIRAMATE 100 MG PO TABS: 100.0000 mg | ORAL_TABLET | Freq: Two times a day (BID) | ORAL | 4 refills | Status: DC

## 2020-04-26 MED ORDER — OMEPRAZOLE 20 MG PO CPDR: 40.0000 mg | DELAYED_RELEASE_CAPSULE | Freq: Every day | ORAL | 4 refills | Status: DC

## 2020-04-27 ENCOUNTER — Encounter: Payer: Self-pay | Admitting: Family Medicine

## 2020-04-27 ENCOUNTER — Other Ambulatory Visit: Payer: Self-pay

## 2020-04-27 DIAGNOSIS — I10 Essential (primary) hypertension: Secondary | ICD-10-CM

## 2020-04-27 MED ORDER — AMLODIPINE BESYLATE 5 MG PO TABS: 5.0000 mg | ORAL_TABLET | Freq: Every day | ORAL | 4 refills | Status: DC

## 2020-04-27 NOTE — Telephone Encounter (Signed)
RX resent to [facility] was originally sent to [facility]

## 2020-06-06 ENCOUNTER — Encounter: Payer: Self-pay | Admitting: Family Medicine

## 2020-06-06 DIAGNOSIS — F410 Panic disorder [episodic paroxysmal anxiety] without agoraphobia: Secondary | ICD-10-CM

## 2020-06-12 ENCOUNTER — Telehealth: Payer: Self-pay

## 2020-06-12 MED ORDER — ALPRAZOLAM 1 MG PO TABS: 1.0000 mg | ORAL_TABLET | Freq: Three times a day (TID) | ORAL | 1 refills | Status: DC | PRN

## 2020-06-12 NOTE — Telephone Encounter (Signed)
Patient called to get an update on her mychart message from last week sent on 06/06/2020. She states she hasn't heard from anyone. She wants to increase her Alprazolam. I advised patient that she needs to schedule an office visit to discuss adjusting medication dosage. Patient agreed to appointment. Virtual appointment scheduled for Friday 06/16/2020 at 1pm.

## 2020-06-13 NOTE — Telephone Encounter (Signed)
Appt canceled. Patient advised.

## 2020-06-13 NOTE — Telephone Encounter (Signed)
Pt called to see if she still needed the appt for Friday since Dr. Sherrie Mustache has sent in the medication Alprazolam/ Pt didn't want another bill if its not needed/ please advise

## 2020-06-13 NOTE — Telephone Encounter (Signed)
No, she can cancel Friday's appointment

## 2020-06-16 ENCOUNTER — Telehealth: Payer: Self-pay | Admitting: Family Medicine

## 2020-09-12 ENCOUNTER — Other Ambulatory Visit: Payer: Self-pay | Admitting: Family Medicine

## 2020-09-12 ENCOUNTER — Encounter: Payer: Self-pay | Admitting: Family Medicine

## 2020-09-12 DIAGNOSIS — F410 Panic disorder [episodic paroxysmal anxiety] without agoraphobia: Secondary | ICD-10-CM

## 2020-09-12 NOTE — Telephone Encounter (Signed)
Requested medication (s) are due for refill today:yes  Requested medication (s) are on the active medication list:yes  Last refill: 06/12/20   #90  1 refill  Future visit scheduled no  Notes to clinic:Not delegated  Requested Prescriptions  Pending Prescriptions Disp Refills   ALPRAZolam (XANAX) 1 MG tablet [Pharmacy Med Name: ALPRAZolam 1 MG Oral Tablet] 90 tablet 0    Sig: Take 1 tablet by mouth three times daily as needed      Not Delegated - Psychiatry:  Anxiolytics/Hypnotics Failed - 09/12/2020  7:23 PM      Failed - This refill cannot be delegated      Failed - Urine Drug Screen completed in last 360 days      Failed - Valid encounter within last 6 months    Recent Outpatient Visits           7 months ago Raynaud's disease without gangrene   [facility] Malva Limes, MD   1 year ago Anxiety   [facility] Malva Limes, MD   1 year ago Essential hypertension   [facility] Malva Limes, MD   2 years ago Depression, unspecified depression type   [facility] Malva Limes, MD   3 years ago Essential hypertension   [facility] Sherrie Mustache, Demetrios Isaacs, MD

## 2020-09-26 ENCOUNTER — Telehealth: Payer: Self-pay | Admitting: Family Medicine

## 2020-09-26 NOTE — Telephone Encounter (Signed)
Pt called stating that her omeprazole she realizes was changed to 1x a day instead of 2x a day. She states that she was not aware of the change and is requesting to have it changed back to the original prescription. She is requesting to speak with PCP regarding the change and why he changed it. Please advise.       [facility] [place], [place] - [address]  [address] [place] [address]  Phone: [phone] Fax: [phone]  Hours: Not open 24 hours

## 2020-09-27 NOTE — Telephone Encounter (Signed)
No, it shouldn't cause any harm. The maximum approved dose is 120mg  a day.... although that dose is for other problems. Anything higher than 40mg  just doesn't work any better than 40mg  a day.

## 2020-09-27 NOTE — Telephone Encounter (Signed)
Patient advised and verbalized understanding 

## 2020-09-27 NOTE — Telephone Encounter (Signed)
Per chart patient she is supposed to be taking Omeprazole 20mg , two capsules daily. Last prescription sent in on 04/26/2020 reflects those instructions. I  called pharmacy and spoke with pharmacist, who advised me that the last dispensed prescription they gave patient a 40mg  capsule to take once a day , instead of giving her two 20mg  capsules. This is what they had on hand to give at that time. I called patient and advised her of this and she told me that the pharmacy didn't inform her that they were making that change. Patient states she didn't realize that the dose was 40mg  capsules. She says she has been taking 2 capsules of Omeprazole 40mg  daily (totaling 80mg  daily). Patient wants to know if that was harmful.

## 2020-12-18 ENCOUNTER — Telehealth: Payer: Self-pay

## 2020-12-18 MED ORDER — ALBUTEROL SULFATE HFA 108 (90 BASE) MCG/ACT IN AERS: 2.0000 | INHALATION_SPRAY | Freq: Four times a day (QID) | RESPIRATORY_TRACT | 3 refills | Status: DC | PRN

## 2020-12-18 NOTE — Telephone Encounter (Signed)
[facility] faxed refill request for the following medications:  albuterol (PROVENTIL HFA) 108 (90 Base) MCG/ACT inhaler   Please advise.

## 2021-01-07 ENCOUNTER — Encounter: Payer: Self-pay | Admitting: Family Medicine

## 2021-01-07 DIAGNOSIS — F410 Panic disorder [episodic paroxysmal anxiety] without agoraphobia: Secondary | ICD-10-CM

## 2021-01-08 MED ORDER — ALPRAZOLAM 1 MG PO TABS: 1.0000 mg | ORAL_TABLET | Freq: Three times a day (TID) | ORAL | 0 refills | Status: DC | PRN

## 2021-01-22 ENCOUNTER — Telehealth: Payer: Self-pay | Admitting: Family Medicine

## 2021-01-22 ENCOUNTER — Other Ambulatory Visit: Payer: Self-pay

## 2021-01-22 DIAGNOSIS — M5126 Other intervertebral disc displacement, lumbar region: Secondary | ICD-10-CM

## 2021-01-22 DIAGNOSIS — G8929 Other chronic pain: Secondary | ICD-10-CM

## 2021-01-22 DIAGNOSIS — M5136 Other intervertebral disc degeneration, lumbar region: Secondary | ICD-10-CM

## 2021-01-22 DIAGNOSIS — M545 Low back pain, unspecified: Secondary | ICD-10-CM

## 2021-01-22 DIAGNOSIS — M4800 Spinal stenosis, site unspecified: Secondary | ICD-10-CM

## 2021-01-22 NOTE — Progress Notes (Unsigned)
MyChart Video Visit    Virtual Visit via Video Note   This visit type was conducted due to national recommendations for restrictions regarding the COVID-19 Pandemic (e.g. social distancing) in an effort to limit this patient's exposure and mitigate transmission in our community. This patient is at least at moderate risk for complications without adequate follow up. This format is felt to be most appropriate for this patient at this time. Physical exam was limited by quality of the video and audio technology used for the visit.   Patient location: *** Provider location: ***  I discussed the limitations of evaluation and management by telemedicine and the availability of in person appointments. The patient expressed understanding and agreed to proceed.  Patient: Marilyn Brown   DOB: 1970-10-08   50 y.o. Female  MRN: [id number] Visit Date: 01/22/2021  Today's healthcare provider: Mila Merry, MD   No chief complaint on file.  Subjective    Back Pain This is a chronic problem. The current episode started more than 1 year ago. The problem occurs constantly. The problem is unchanged. The pain is present in the lumbar spine. The quality of the pain is described as aching, stabbing, shooting and burning. The pain radiates to the right foot and right thigh (radiates around to both hips). The pain is severe. The pain is the same all the time. The symptoms are aggravated by bending, sitting, twisting and position. Stiffness is present: not stiff. Associated symptoms include numbness and tingling. Risk factors: family history of arthritis. She has tried nothing (She only gets relief when she is asleep. ) for the symptoms. The treatment provided no relief.    Patient would like to discuss financial benefits perks.  Patient would like blood work done. Patient would like a referral to a closer location as she is 2 hours away.   Patient was wondering why Paxil was on her medication  list?   {Show patient history (optional):23778::" "}  Medications: Outpatient Medications Prior to Visit  Medication Sig  . albuterol (PROVENTIL HFA) 108 (90 Base) MCG/ACT inhaler Inhale 2 puffs into the lungs every 6 (six) hours as needed for wheezing or shortness of breath.  . ALPRAZolam (XANAX) 1 MG tablet Take 1 tablet (1 mg total) by mouth 3 (three) times daily as needed.  Marland Kitchen amLODipine (NORVASC) 5 MG tablet Take 1 tablet (5 mg total) by mouth daily.  Marland Kitchen atenolol (TENORMIN) 50 MG tablet Take 1 tablet (50 mg total) by mouth 2 (two) times daily.  . baclofen (LIORESAL) 10 MG tablet Take 20 mg by mouth 3 (three) times daily.   . busPIRone (BUSPAR) 15 MG tablet Take 1 tablet (15 mg total) by mouth 2 (two) times daily.  . Cholecalciferol (QC VITAMIN D3) 50 MCG (2000 UT) TABS Take by mouth.  . Fluticasone-Salmeterol (ADVAIR DISKUS) 250-50 MCG/DOSE AEPB Inhale 1 puff into the lungs 2 (two) times daily.  Marland Kitchen gabapentin (NEURONTIN) 300 MG capsule Take two tablets three times daily, and three tablets at bedtime (Patient taking differently: 300 mg 4 (four) times daily. Takes 4 cap every morning, 3 cap in the afternoon, then 3 capsules at night)  . mirtazapine (REMERON) 30 MG tablet Take 1 tablet (30 mg total) by mouth at bedtime.  Marland Kitchen omeprazole (PRILOSEC) 20 MG capsule Take 2 capsules (40 mg total) by mouth daily.  Marland Kitchen PARoxetine (PAXIL) 10 MG tablet Take 1 tablet (10 mg total) by mouth daily.  Marland Kitchen PREMARIN 0.3 MG tablet Take 0.3 mg by  mouth daily.  Marland Kitchen topiramate (TOPAMAX) 100 MG tablet Take 1 tablet (100 mg total) by mouth 2 (two) times daily.   No facility-administered medications prior to visit.    Review of Systems  Musculoskeletal: Positive for back pain.  Neurological: Positive for tingling and numbness.    {Labs  Heme  Chem  Endocrine  Serology  Results Review (optional):23779::" "}  Objective    There were no vitals taken for this visit. {Show previous vital signs (optional):23777::"  "}  Physical Exam     Assessment & Plan     ***  No follow-ups on file.     I discussed the assessment and treatment plan with the patient. The patient was provided an opportunity to ask questions and all were answered. The patient agreed with the plan and demonstrated an understanding of the instructions.   The patient was advised to call back or seek an in-person evaluation if the symptoms worsen or if the condition fails to improve as anticipated.  I provided *** minutes of non-face-to-face time during this encounter.  {provider attestation***:1}  Mila Merry, MD [facility] [phone] (phone) [phone] (fax)  [facility] : [facility]

## 2021-02-11 ENCOUNTER — Encounter: Payer: Self-pay | Admitting: Family Medicine

## 2021-02-11 DIAGNOSIS — G8929 Other chronic pain: Secondary | ICD-10-CM

## 2021-02-11 DIAGNOSIS — M545 Low back pain, unspecified: Secondary | ICD-10-CM

## 2021-03-01 ENCOUNTER — Other Ambulatory Visit: Payer: Self-pay | Admitting: Family Medicine

## 2021-03-01 DIAGNOSIS — F410 Panic disorder [episodic paroxysmal anxiety] without agoraphobia: Secondary | ICD-10-CM

## 2021-03-01 NOTE — Telephone Encounter (Signed)
Requested medications are due for refill today  (mail order, 1 week early)  Requested medications are on the active medication list yes  Last refill 4/11 (mail order)  Last visit 07/2019  Future visit scheduled no  Notes to clinic Not Delegated.

## 2021-03-02 ENCOUNTER — Telehealth: Payer: Self-pay | Admitting: Family Medicine

## 2021-03-02 DIAGNOSIS — F410 Panic disorder [episodic paroxysmal anxiety] without agoraphobia: Secondary | ICD-10-CM

## 2021-03-02 MED ORDER — ALPRAZOLAM 1 MG PO TABS: 1.0000 mg | ORAL_TABLET | Freq: Three times a day (TID) | ORAL | 3 refills | Status: DC | PRN

## 2021-03-02 NOTE — Telephone Encounter (Signed)
   Notes to clinic: Script failed when sent Please resend    Requested Prescriptions  Pending Prescriptions Disp Refills   ALPRAZolam (XANAX) 1 MG tablet 90 tablet 0    Sig: Take 1 tablet (1 mg total) by mouth 3 (three) times daily as needed.      There is no refill protocol information for this order

## 2021-03-02 NOTE — Telephone Encounter (Signed)
Copied from CRM [id number]. Topic: Quick Communication - Rx Refill/Question >> Mar 02, 2021 12:31 PM Marilyn Brown A wrote: Medication: ALPRAZolam Prudy Feeler) 1 MG tablet   Has the patient contacted their pharmacy? Yes. The patient has contacted their pharmacy  Preferred Pharmacy (with phone number or street name): [facility] [address], [place] - [address]  Phone:  [phone] Fax:  [phone]  Agent: Please be advised that RX refills may take up to 3 business days. We ask that you follow-up with your pharmacy.

## 2021-03-23 LAB — HM MAMMOGRAPHY

## 2021-04-06 ENCOUNTER — Other Ambulatory Visit: Payer: Self-pay

## 2021-04-06 DIAGNOSIS — G43109 Migraine with aura, not intractable, without status migrainosus: Secondary | ICD-10-CM

## 2021-04-06 DIAGNOSIS — I10 Essential (primary) hypertension: Secondary | ICD-10-CM

## 2021-04-06 DIAGNOSIS — I7300 Raynaud's syndrome without gangrene: Secondary | ICD-10-CM

## 2021-04-06 MED ORDER — OMEPRAZOLE 20 MG PO CPDR: 40.0000 mg | DELAYED_RELEASE_CAPSULE | Freq: Every day | ORAL | 4 refills | Status: AC

## 2021-04-06 MED ORDER — AMLODIPINE BESYLATE 5 MG PO TABS: 5.0000 mg | ORAL_TABLET | Freq: Every day | ORAL | 4 refills | Status: AC

## 2021-04-06 MED ORDER — TOPIRAMATE 100 MG PO TABS: 100.0000 mg | ORAL_TABLET | Freq: Two times a day (BID) | ORAL | 4 refills | Status: AC

## 2021-04-06 MED ORDER — ALBUTEROL SULFATE HFA 108 (90 BASE) MCG/ACT IN AERS: 2.0000 | INHALATION_SPRAY | Freq: Four times a day (QID) | RESPIRATORY_TRACT | 3 refills | Status: DC | PRN

## 2021-04-06 NOTE — Telephone Encounter (Signed)
Patient request refills on her medications. She needs them all sent to [facility] except for Atenolol. She wants Atenolol sent to [facility] pharmacy in [place], [place].   Patient says that she had a telephone visit with Dr. Sherrie Mustache a few months ago, and he mentioned that he wanted to order labs. Patient wants to have blood work done at a [facility]. Patient is requesting that we fax a lab order to [facility] so that she can have her blood work done. I advised patient that she would need to provide [place] with a fax number to the location she wanted to have labs done at. Patient is going to call us back with that fax number. Please advise on lab order request. I also didn't see any documentation of a recent completed telephone visit.

## 2021-04-10 ENCOUNTER — Encounter: Payer: Self-pay | Admitting: Family Medicine

## 2021-04-11 ENCOUNTER — Telehealth: Payer: Self-pay

## 2021-04-11 DIAGNOSIS — I10 Essential (primary) hypertension: Secondary | ICD-10-CM

## 2021-04-11 NOTE — Telephone Encounter (Signed)
Copied from CRM [id number]. Topic: General - Other >> Apr 10, 2021  3:19 PM Marylen Ponto wrote: Reason for CRM: Pt stated she would like Roshena to return her call in regards to labs orders. Pt stated she would like the orders to go to [facility] attention Darl Pikes fax# [phone]. Pt asked that Ples Specter return her call as she has some concerns that she would like to discuss regarding the labs.

## 2021-04-11 NOTE — Telephone Encounter (Signed)
Patient is requesting a lab order. She would like to have blood work done at [facility] and request that we fax the order to the number below. Please advise.

## 2021-04-11 NOTE — Telephone Encounter (Signed)
I advised Ms. Kassel on what labs where being order.  They have also been faxed to the number below.   Thanks,   -Vernona Rieger

## 2021-04-11 NOTE — Telephone Encounter (Signed)
Can print order for her for cbc, met c and lipids

## 2021-04-11 NOTE — Telephone Encounter (Signed)
Patient called back wanting more details of labs. I gave her the info in the message. She is still wanting call back

## 2021-04-16 ENCOUNTER — Encounter: Payer: Self-pay | Admitting: Family Medicine

## 2021-04-16 DIAGNOSIS — F32A Depression, unspecified: Secondary | ICD-10-CM

## 2021-04-17 MED ORDER — MIRTAZAPINE 30 MG PO TABS: 30.0000 mg | ORAL_TABLET | Freq: Every day | ORAL | 3 refills | Status: AC

## 2021-05-16 ENCOUNTER — Telehealth: Payer: Self-pay

## 2021-05-16 ENCOUNTER — Encounter: Payer: Self-pay | Admitting: Family Medicine

## 2021-05-16 NOTE — Telephone Encounter (Signed)
Copied from CRM [id number]. Topic: General - Other >> May 16, 2021  1:31 PM Marilyn Brown wrote: Reason for CRM: Pt stated she had labs done and the results were sent to Dr. Sherrie Mustache. Pt wants to know if her lab results were received. Pt requests call back

## 2021-05-23 NOTE — Telephone Encounter (Signed)
Pt calling in regards to the message left by PCP. She states that she has some questions and is requesting to have a call from nurse. Please advise.

## 2021-05-29 ENCOUNTER — Encounter: Payer: Self-pay | Admitting: Family Medicine

## 2021-05-29 NOTE — Telephone Encounter (Signed)
FYI

## 2021-05-29 NOTE — Telephone Encounter (Signed)
Pt calling in again very upset. She started to cry and say that she does not feel like she is getting taken care of in a timely manner. She is requesting to have a response as she is not sure what's going with her body. I advised pt of message sent back to PCP. Please advise.

## 2021-06-19 ENCOUNTER — Encounter: Payer: Self-pay | Admitting: Family Medicine

## 2021-06-25 ENCOUNTER — Encounter: Payer: Self-pay | Admitting: Family Medicine

## 2021-06-25 ENCOUNTER — Telehealth (INDEPENDENT_AMBULATORY_CARE_PROVIDER_SITE_OTHER): Payer: Self-pay | Admitting: Family Medicine

## 2021-06-25 DIAGNOSIS — R634 Abnormal weight loss: Secondary | ICD-10-CM

## 2021-06-25 DIAGNOSIS — R053 Chronic cough: Secondary | ICD-10-CM

## 2021-06-25 DIAGNOSIS — F410 Panic disorder [episodic paroxysmal anxiety] without agoraphobia: Secondary | ICD-10-CM

## 2021-06-25 DIAGNOSIS — Z720 Tobacco use: Secondary | ICD-10-CM

## 2021-06-25 DIAGNOSIS — I10 Essential (primary) hypertension: Secondary | ICD-10-CM

## 2021-06-25 DIAGNOSIS — M533 Sacrococcygeal disorders, not elsewhere classified: Secondary | ICD-10-CM

## 2021-06-25 DIAGNOSIS — K219 Gastro-esophageal reflux disease without esophagitis: Secondary | ICD-10-CM

## 2021-06-25 MED ORDER — ALPRAZOLAM 1 MG PO TABS: 1.0000 mg | ORAL_TABLET | Freq: Three times a day (TID) | ORAL | 3 refills | Status: DC | PRN

## 2021-06-25 MED ORDER — ATENOLOL 50 MG PO TABS: 50.0000 mg | ORAL_TABLET | Freq: Two times a day (BID) | ORAL | 4 refills | Status: AC

## 2021-06-25 NOTE — Patient Instructions (Signed)
.   Please review the attached list of medications and notify my office if there are any errors.   . Please bring all of your medications to every appointment so we can make sure that our medication list is the same as yours.   

## 2021-06-25 NOTE — Progress Notes (Signed)
MyChart Video Visit    Virtual Visit via Video Note   This visit type was conducted due to national recommendations for restrictions regarding the COVID-19 Pandemic (e.g. social distancing) in an effort to limit this patient's exposure and mitigate transmission in our community. This patient is at least at moderate risk for complications without adequate follow up. This format is felt to be most appropriate for this patient at this time. Physical exam was limited by quality of the video and audio technology used for the visit.   Patient location: home Provider location: [facility]  I discussed the limitations of evaluation and management by telemedicine and the availability of in person appointments. The patient expressed understanding and agreed to proceed.  Patient: Marilyn Brown   DOB: [birth year]   50 y.o. Female  MRN: [id number] Visit Date: 06/25/2021  Today's healthcare provider: Mila Merry, MD   Chief Complaint  Patient presents with   Follow-up    Subjective    HPI  Panic disorder without agoraphobia, Follow-up  She was last seen for anxiety 16 months ago. Changes made at last visit include restart Alprazolam 0.5mg  TID and Paxil 10mg  qd .   She reports poor compliance with treatment. Patient states that she takes Alprazolam 1mg  TID and states that she never started prescription for Paxil 10 mg, she states she does not know why she was prescribed Paxil.  She reports excellent tolerance of treatment. She is not having side effects.   She feels her anxiety is mild and Worse since last visit.  Symptoms: No chest pain No difficulty concentrating  No dizziness Yes fatigue  No feelings of losing control No insomnia  Yes irritable Yes palpitations  Yes panic attacks No racing thoughts  No shortness of breath No sweating  No tremors/shakes    GAD-7 Results GAD-7 Generalized Anxiety Disorder Screening Tool 02/01/2020  1. Feeling Nervous, Anxious, or on Edge 3  2. Not  Being Able to Stop or Control Worrying 1  3. Worrying Too Much About Different Things 0  4. Trouble Relaxing 3  5. Being So Restless it's Hard To Sit Still 0  6. Becoming Easily Annoyed or Irritable 0  7. Feeling Afraid As If Something Awful Might Happen 0  Total GAD-7 Score 7  Difficulty At Work, Home, or Getting  Along With Others? Very difficult    PHQ-9 Scores PHQ9 SCORE ONLY 02/01/2020 11/03/2017 07/07/2015  PHQ-9 Total Score 5 6 10     ---------------------------------------------------------------------------------------------------  Follow up for Raynaud's disease without gangrene  The patient was last seen for this 16 months ago. Changes made at last visit include none, continue Topamax.  She reports good compliance with treatment. She feels that condition is Unchanged. She is not having side effects.   -----------------------------------------------------------------------------------------   She reports that she feels poorly, has lost 30 pounds over the last year. Complains of poor appetite. Nauseated every morning. Complains of constipation. Has frequent heart burn. Taking one omeprazole daily. Also complains of chronic cough productive yellow mucous. Is short of breath. Is smoking 2 ppd.   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 Only comfortable when lying on stomach. Has been going to pain clinic for years with no improvement.     Medications: Outpatient Medications Prior to Visit  Medication Sig   albuterol (PROVENTIL HFA) 108 (90 Base) MCG/ACT inhaler Inhale 2 puffs into the lungs every 6 (  six) hours as needed for wheezing or shortness of breath.   ALPRAZolam (XANAX) 1 MG tablet Take 1 tablet (1 mg total) by mouth 3 (three) times daily as needed.   amLODipine (NORVASC) 5 MG tablet Take 1 tablet (5 mg total) by mouth daily.   atenolol (TENORMIN) 50 MG tablet Take 1  tablet (50 mg total) by mouth 2 (two) times daily.   baclofen (LIORESAL) 10 MG tablet Take 20 mg by mouth 3 (three) times daily.    busPIRone (BUSPAR) 15 MG tablet Take 1 tablet (15 mg total) by mouth 2 (two) times daily.   Cholecalciferol (QC VITAMIN D3) 50 MCG (2000 UT) TABS Take by mouth.   Fluticasone-Salmeterol (ADVAIR DISKUS) 250-50 MCG/DOSE AEPB Inhale 1 puff into the lungs 2 (two) times daily.   gabapentin (NEURONTIN) 300 MG capsule Take two tablets three times daily, and three tablets at bedtime (Patient taking differently: 300 mg 4 (four) times daily. Takes 4 cap every morning, 3 cap in the afternoon, then 3 capsules at night)   mirtazapine (REMERON) 30 MG tablet Take 1 tablet (30 mg total) by mouth at bedtime.   omeprazole (PRILOSEC) 20 MG capsule Take 2 capsules (40 mg total) by mouth daily.   PREMARIN 0.3 MG tablet Take 0.3 mg by mouth daily.   topiramate (TOPAMAX) 100 MG tablet Take 1 tablet (100 mg total) by mouth 2 (two) times daily.   No facility-administered medications prior to visit.    Review of Systems    Objective    There were no vitals taken for this visit.   Physical Exam   Awake, alert, oriented x 3. In no apparent distress   Assessment & Plan    1. Chronic cough  - DG Chest 2 View; Future  2. Gastroesophageal reflux disease, unspecified whether esophagitis present Uncontrolled on omeprazole and associated with abnormal weight loss - Ambulatory referral to Gastroenterology. She livers in [facility] and gets patient assistance through [place]  3. Tobacco abuse Encouraged smoking cessation. Likely has COPD. Needs lung cancer screening. Chest Xr pending.   4. Abnormal weight loss  - Ambulatory referral to Gastroenterology  5. Pain, coccyx  - DG Sacrum/Coccyx; Future  6. Panic disorder without agoraphobia refill ALPRAZolam (XANAX) 1 MG tablet; Take 1 tablet (1 mg total) by mouth 3 (three) times daily as needed.  Dispense: 90 tablet; Refill:  3  7. Essential hypertension refill atenolol (TENORMIN) 50 MG tablet; Take 1 tablet (50 mg total) by mouth 2 (two) times daily.  Dispense: 180 tablet; Refill: 4   Counseled that her being unable to travel to [place] severely limits my ability to evaluate and treat her. She was strongly encouraged to find a PCP closer to her home. Also suggested she could go to local urgent care for evaluation of cough and tailbone pain since she is unable to come to [place] until November.        I discussed the assessment and treatment plan with the patient. The patient was provided an opportunity to ask questions and all were answered. The patient agreed with the plan and demonstrated an understanding of the instructions.   The patient was advised to call back or seek an in-person evaluation if the symptoms worsen or if the condition fails to improve as anticipated.  I provided 15 minutes of non-face-to-face time during this encounter.  The entirety of the information documented in the History of Present Illness, Review of Systems and Physical Exam were personally obtained by me. Portions  of this information were initially documented by the CMA and reviewed by me for thoroughness and accuracy.    Mila Merry, MD [facility] [phone] (phone) [phone] (fax)  [facility]

## 2021-07-02 ENCOUNTER — Telehealth: Payer: Self-pay | Admitting: Family Medicine

## 2021-07-02 ENCOUNTER — Encounter: Payer: Self-pay | Admitting: Family Medicine

## 2021-07-02 DIAGNOSIS — M533 Sacrococcygeal disorders, not elsewhere classified: Secondary | ICD-10-CM

## 2021-07-02 DIAGNOSIS — R053 Chronic cough: Secondary | ICD-10-CM

## 2021-07-02 NOTE — Telephone Encounter (Signed)
I called patient. She is requesting that we send an order to [facility] in [place] so that she can have the x rays done. Patient was able to give me a number to a location that is close to her [phone]. I called the number but that department had already closed for today. I left a message for someone to call me back.  Will try calling back tomorrow for a fax number.

## 2021-07-02 NOTE — Telephone Encounter (Signed)
Patient also sent mychart message stating that she want's chest x ray orders to be sent to a [facility] location. Please see mychart message.

## 2021-07-02 NOTE — Telephone Encounter (Signed)
Pt called about to see if her orders were sent to [facility] / advised pt the orders I see are for [facility]/ she asked if Dr. Sherrie Mustache can send order to [facility] / please call pt and advise when ordered

## 2021-07-03 NOTE — Addendum Note (Signed)
Addended by: Benjiman Core on: 07/03/2021 04:02 PM   Modules accepted: Orders

## 2021-07-03 NOTE — Addendum Note (Signed)
Addended by: Benjiman Core on: 07/03/2021 03:13 PM   Modules accepted: Orders

## 2021-07-09 NOTE — Telephone Encounter (Addendum)
I called and spoke with patient. She is going to wait to have x rays done on the same day that she has an appointment with Dr. Sherrie Mustache on 08/06/2021. Patient plans to have imaging done immediately after that appointment at [facility] (on [address]). It looks like x ray order will be expired by then, so a new order may need to be placed. Patient advised of this. Will plan to place new orders for x rays during office visit on 08/06/2021 if ok with Dr. Sherrie Mustache.

## 2021-08-06 ENCOUNTER — Ambulatory Visit: Admitting: Gastroenterology

## 2021-08-06 ENCOUNTER — Ambulatory Visit: Payer: Medicaid Other | Admitting: Family Medicine

## 2021-08-20 ENCOUNTER — Telehealth: Payer: Self-pay

## 2021-08-20 ENCOUNTER — Ambulatory Visit: Payer: Self-pay | Admitting: *Deleted

## 2021-08-20 NOTE — Telephone Encounter (Signed)
Reason for Disposition  [1] Systolic BP 90-110 AND [2] taking blood pressure medications AND [3] NOT dizzy, lightheaded or weak  Answer Assessment - Initial Assessment Questions 1. BLOOD PRESSURE: "What is the blood pressure?" "Did you take at least two measurements 5 minutes apart?"     Pt calling in.   Her BP is low.   I went to dr in Sept. And they asked if my BP had been that low for that long.   I have a BP cuff.  It's 90/60 something.   I haven't been checking it.  2. ONSET: "When did you take your blood pressure?"     Yesterday it was 95/58.  I took 50mg  of Atenolol before that.  I checked my BP again and it was 93/60.    I checked it again 96/61 yesterday. I need to start writing it down.   I'm taking screen shots of the readings. 3. HOW: "How did you obtain the blood pressure?" (e.g., visiting nurse, automatic home BP monitor)     Has a BP machine at home.   I have a wrist monitor.   I'm holding it against my chest at heart level and both feet on the floor. Yesterday 5:00 PM 117/68.    4. HISTORY: "Do you have a history of low blood pressure?" "What is your blood pressure normally?"     Yes.  My previous dr when I was pregnant, a long time ago, 26 yrs ago my BP was 160/something and was told I would have a heart attack.    I started the atenolol back then and been on it since then.    5. MEDICATIONS: "Are you taking any medications for blood pressure?" If Yes, ask: "Have they been changed recently?"     Amlodipine and atenolol. Been on amlodipine a very long time.  And the atenolol too I'm having nausea.   I told Dr. 01-05-1975 on the video visit it's hard to keep food down in the mornings.  I have terrible nausea in the mornings.   I don't know if it's the medications or what.   It happens after I take my medicines in the mornings.   I take gabapentin, atenolol, amlodipine, baclofen all at once in the mornings on an empty stomach.   This has been going on for the past couple of months. 6.  PULSE RATE: "Do you know what your pulse rate is?"      From her BP machine.   64, 66,  64, 68.   I don't know how long this has been going on. 7. OTHER SYMPTOMS: "Have you been sick recently?" "Have you had a recent injury?"     Nausea every morning for the last 2 months.   My BP is running low too.  I have an appt on Dec. 12th with Dr. 04-30-1974.    I have to keep that appt due to financial needs.    I'm not vomiting but very nauseas.   No shortness of breath or chest pain, dizziness, or passing out.     I'm not steady on my feet at all.   I went to sit on the toilet this morning during the night and sat down and fell forward and smashed my face into the floor.  I'm sure my face is going to be bruised.   (She's going to look in the mirror now).   "It's sore".   "I'm sure I'm going to bruise up".  It was 2:30 AM.   I was sleepy.   It's not normal for me to do that but it was 2:30 and I was sleepy.    I don't know.  "I didn't pass out".  "I just sit down and fell forward".   "I didn't have any balance".   "I was able to push myself up after I hollered for my Finance about 15 times".     "I feel sleepy right now".    "I'm always sleepy and tired".   "I don't work".    Not dizzy when first gets up from sitting.    Should I continue my BP medications?    Can you give Rachena a message?    She's Dr. Theodis Aguas nurse.   "She can probably tell me what I should do".   I let her know I could not advise her on how to take her BP medications that would need to come from Dr. Sherrie Mustache.   I also suggested she be seen before her appt but she said due to financial constraints she can't afford to come in before then.     8. PREGNANCY: "Is there any chance you are pregnant?" "When was your last menstrual period?"     Not asked  Protocols used: Blood Pressure - Low-A-AH

## 2021-08-20 NOTE — Telephone Encounter (Signed)
See triage message.

## 2021-08-20 NOTE — Telephone Encounter (Signed)
Pt called in c/o her BP being low 90s/60s.   She does not know what her BP normally is since she doesn't normally check it.  She is c/o have bad nausea every morning for the last 2 months.  Not vomiting just nausea.   (She sounds very sleepy, tired).    When I mentioned this she said she is tired and sleepy all the time.   She doesn't work so it's not from working. See notes where  she fell off the toilet last night and "smashed her face on the floor".    She didn't pass out of feel dizzy before it happened.   She said,   "My balance is just off".   "I'm really off with my balance".    She has an appt with Dr. Sherrie Mustache coming up in Dec. But she doesn't want to try and come in before that due to financial restraints.   I told her she needed to be seen by Dr. Sherrie Mustache before then.     She was questioning whether she should take her BP medication or not and how much with her BP being low.   I let her know I could not advise her on that.   Dr. Sherrie Mustache would need to do that.    She asked me to send a note to Dr. Theodis Aguas nurse Ples Specter, CMA.    "Maybe she can tell me what I need to do about taking my medicines".   I let her know I would get this message to the office for Roshena in the office.  I sent my notes to [facility] high priority.

## 2021-08-20 NOTE — Telephone Encounter (Signed)
Copied from CRM [id number]. Topic: General - Other >> Aug 20, 2021  1:36 PM Pawlus, Maxine Glenn A wrote: Reason for CRM: Pt spoke to NT and was already triaged, pt also requested to speak directly with Roshena regarding her medications. >> Aug 20, 2021  1:43 PM Gaetana Michaelis A wrote: Patient has made an additional call about this matter and would like to speak with a member of staff directly  The patient has declined to speak with Nurse Triage and would like to speak with R. Chambers directly   Please contact further when available

## 2021-08-21 NOTE — Telephone Encounter (Signed)
She can stay off the atenolol and the amlodipine as long as her blood pressure stays below 140/90. If it gets above 140/90 then take atenolol 1/2 tablet twice a day. If she continues to feel dizzy she should go to urgent care for evaluation.

## 2021-08-21 NOTE — Telephone Encounter (Signed)
Patient advised and verbalized understanding 

## 2021-09-05 ENCOUNTER — Telehealth: Payer: Self-pay | Admitting: Gastroenterology

## 2021-09-05 NOTE — Telephone Encounter (Signed)
LVM for pt to call back to r/s appt due to provider had family emergency 

## 2021-09-10 ENCOUNTER — Ambulatory Visit: Admitting: Gastroenterology

## 2021-09-10 ENCOUNTER — Ambulatory Visit: Payer: Medicaid Other | Admitting: Family Medicine

## 2021-09-10 ENCOUNTER — Telehealth: Payer: Self-pay

## 2021-09-10 NOTE — Telephone Encounter (Signed)
Copied from CRM [id number]. Topic: General - Inquiry >> Sep 10, 2021  7:56 AM Marilyn Brown wrote: Reason for CRM: Pts boyfriend Dellie Burns calling this morning to let Dr Sherrie Mustache know the pt is in the hospital at [facility].  He said a week ago Sunday he came home to find her in the bed covered in blood.  She had blood all in her hair, on her face, on the floor, in the bed.  He said emt's took her to [facility]  The sheriff came and investigator, who told her he thinks she fell.  However, he said they will not let him see her.  He finally spoke to her last night after a week and she asked him to call and cancel her appt.

## 2021-09-20 ENCOUNTER — Telehealth: Payer: Self-pay

## 2021-09-20 NOTE — Telephone Encounter (Signed)
Copied from CRM [id number]. Topic: General - Other >> Sep 20, 2021  9:04 AM Marilyn Brown wrote: Reason for CRM: Pt called stating that she is needing to speak with [name]. She states this is in regards to her recent hospital stay and her conversation with her fiancee. Please advise.

## 2021-09-21 NOTE — Telephone Encounter (Signed)
Pt called in to follow up on call back. Pt says that she would like to update her PCP with her hospital visit. Offered to schedule pt an hospital follow up. Pt says that she wasn't told to follow up with PCP. Pt says that she has a catheter and a transitional walker. Pt says that she was told that she would have follow up visits with providers but pt is unsure of who and where.

## 2021-09-25 ENCOUNTER — Telehealth: Payer: Self-pay

## 2021-09-25 NOTE — Telephone Encounter (Signed)
Patient was given this information from her after visit summary: Discharge Instructions  Attachments    Discharge Instructions Arnoldo Morale - 09/18/2021 8:35 AM EST   Formatting of this note might be different from the original. Mental Health Follow Up Appointment Your follow up appointment has been scheduled for 09/26/21 at 8:40 AM. This will be a virtual appointment. [facility] [address]  [address]  [place] [address] [phone] Electronically signed by Eula Listen, LCSWA at 09/18/2021 12:54 PM EST

## 2021-09-25 NOTE — Telephone Encounter (Signed)
Copied from CRM [id number]. Topic: General - Other >> Sep 25, 2021  2:26 PM Gaetana Michaelis A wrote: Reason for CRM: The patient would like to be contacted by "the staff member that spoke with them earlier today" 09/25/21 about a referral to [facility]  The patient received the information but was uncertain of what number they're supposed to call  The agent saw no relevant information to the request at the time of call   Please contact further

## 2021-09-25 NOTE — Telephone Encounter (Signed)
she can have the 10:20 of the 3pm next monday jan 3. needs to be 40 minutes.

## 2021-09-25 NOTE — Telephone Encounter (Signed)
Patient is requesting a hospital follow up. Did offer with a different provider she is requesting to see PCP only. Please advise.

## 2021-09-26 ENCOUNTER — Other Ambulatory Visit: Payer: Self-pay

## 2021-09-26 ENCOUNTER — Telehealth: Payer: Self-pay | Admitting: Family Medicine

## 2021-09-26 MED ORDER — ALBUTEROL SULFATE HFA 108 (90 BASE) MCG/ACT IN AERS: 2.0000 | INHALATION_SPRAY | Freq: Four times a day (QID) | RESPIRATORY_TRACT | 3 refills | Status: AC | PRN

## 2021-09-26 NOTE — Telephone Encounter (Signed)
[facility] faxed refill request for the following medications:  albuterol (PROVENTIL HFA) 108 (90 Base) MCG/ACT inhaler   Please advise.

## 2021-10-02 ENCOUNTER — Inpatient Hospital Stay: Payer: Medicaid Other | Admitting: Family Medicine

## 2021-10-02 ENCOUNTER — Other Ambulatory Visit: Payer: Self-pay | Admitting: Family Medicine

## 2021-10-02 NOTE — Progress Notes (Deleted)
°  ° ° °  Established patient visit   Patient: Marilyn Brown   DOB: 04-Mar-1971   51 y.o. Female  MRN: [id number] Visit Date: 10/02/2021  Today's healthcare provider: Mila Merry, MD   No chief complaint on file.  Subjective    HPI  Follow up Hospitalization  Patient was admitted to *** on *** and discharged on ***. She was treated for ***. Treatment for this included ***. Telephone follow up was done on *** She reports {excellent/good/fair:19992} compliance with treatment. She reports this condition is {resolved/improved/worsened:23923}.  ----------------------------------------------------------------------------------------- -   Medications: Outpatient Medications Prior to Visit  Medication Sig   albuterol (PROVENTIL HFA) 108 (90 Base) MCG/ACT inhaler Inhale 2 puffs into the lungs every 6 (six) hours as needed for wheezing or shortness of breath.   ALPRAZolam (XANAX) 1 MG tablet Take 1 tablet (1 mg total) by mouth 3 (three) times daily as needed.   amLODipine (NORVASC) 5 MG tablet Take 1 tablet (5 mg total) by mouth daily.   atenolol (TENORMIN) 50 MG tablet Take 1 tablet (50 mg total) by mouth 2 (two) times daily.   baclofen (LIORESAL) 10 MG tablet Take 20 mg by mouth 3 (three) times daily.   busPIRone (BUSPAR) 15 MG tablet Take 1 tablet (15 mg total) by mouth 2 (two) times daily.   gabapentin (NEURONTIN) 300 MG capsule Take two tablets three times daily, and three tablets at bedtime (Patient taking differently: 300 mg 4 (four) times daily. Takes 4 cap every morning, 3 cap in the afternoon, then 3 capsules at night)   mirtazapine (REMERON) 30 MG tablet Take 1 tablet (30 mg total) by mouth at bedtime.   omeprazole (PRILOSEC) 20 MG capsule Take 2 capsules (40 mg total) by mouth daily.   PREMARIN 0.3 MG tablet Take 0.3 mg by mouth daily.   topiramate (TOPAMAX) 100 MG tablet Take 1 tablet (100 mg total) by mouth 2 (two) times daily.   No facility-administered medications prior  to visit.    Review of Systems  {Labs   Heme   Chem   Endocrine   Serology   Results Review (optional):23779}   Objective    There were no vitals taken for this visit. {Show previous vital signs (optional):23777}  Physical Exam  ***  No results found for any visits on 10/02/21.  Assessment & Plan     ***  No follow-ups on file.      {provider attestation***:1}   Mila Merry, MD  [facility] [phone] (phone) [phone] (fax)  [facility]

## 2021-10-02 NOTE — Telephone Encounter (Signed)
LOV:   NOV: None  Pt Canceled last apt.   Last Refill: 02/01/2020

## 2021-11-06 ENCOUNTER — Encounter: Payer: Self-pay | Admitting: Family Medicine

## 2021-11-06 DIAGNOSIS — F410 Panic disorder [episodic paroxysmal anxiety] without agoraphobia: Secondary | ICD-10-CM

## 2021-11-06 NOTE — Telephone Encounter (Signed)
Last refill: 06/25/2021 # 90 with 3 refills  Last office visit: 06/25/2021 (virtual visit) Next office visit: No future visit scheduled

## 2021-11-08 MED ORDER — ALPRAZOLAM 1 MG PO TABS: 1.0000 mg | ORAL_TABLET | Freq: Three times a day (TID) | ORAL | 3 refills | Status: AC | PRN

## 2021-12-13 ENCOUNTER — Ambulatory Visit: Payer: Medicaid Other | Admitting: Physician Assistant

## 2021-12-29 DEATH — deceased
